# Patient Record
Sex: Male | Born: 1965 | Race: White | Hispanic: No | Marital: Married | Smoking: Current every day smoker
Health system: Southern US, Community
[De-identification: ages and names within clinical notes are randomized; demographics above are authoritative.]

## PROBLEM LIST (undated history)

## (undated) DIAGNOSIS — R079 Chest pain, unspecified: Secondary | ICD-10-CM

## (undated) DIAGNOSIS — J440 Chronic obstructive pulmonary disease with acute lower respiratory infection: Secondary | ICD-10-CM

## (undated) DIAGNOSIS — Z8249 Family history of ischemic heart disease and other diseases of the circulatory system: Secondary | ICD-10-CM

## (undated) DIAGNOSIS — N529 Male erectile dysfunction, unspecified: Secondary | ICD-10-CM

## (undated) DIAGNOSIS — E119 Type 2 diabetes mellitus without complications: Secondary | ICD-10-CM

## (undated) DIAGNOSIS — E781 Pure hyperglyceridemia: Secondary | ICD-10-CM

## (undated) DIAGNOSIS — G4733 Obstructive sleep apnea (adult) (pediatric): Secondary | ICD-10-CM

## (undated) DIAGNOSIS — F338 Other recurrent depressive disorders: Secondary | ICD-10-CM

## (undated) DIAGNOSIS — E669 Obesity, unspecified: Secondary | ICD-10-CM

## (undated) DIAGNOSIS — E785 Hyperlipidemia, unspecified: Secondary | ICD-10-CM

## (undated) DIAGNOSIS — Z862 Personal history of diseases of the blood and blood-forming organs and certain disorders involving the immune mechanism: Secondary | ICD-10-CM

## (undated) DIAGNOSIS — J209 Acute bronchitis, unspecified: Secondary | ICD-10-CM

## (undated) DIAGNOSIS — J45909 Unspecified asthma, uncomplicated: Secondary | ICD-10-CM

## (undated) HISTORY — DX: Personal history of diseases of the blood and blood-forming organs and certain disorders involving the immune mechanism: Z86.2

## (undated) HISTORY — DX: Chest pain, unspecified: R07.9

## (undated) HISTORY — DX: Obesity, unspecified: E66.9

## (undated) HISTORY — DX: Family history of ischemic heart disease and other diseases of the circulatory system: Z82.49

## (undated) HISTORY — DX: Acute bronchitis, unspecified: J20.9

## (undated) HISTORY — DX: Obstructive sleep apnea (adult) (pediatric): G47.33

## (undated) HISTORY — DX: Male erectile dysfunction, unspecified: N52.9

## (undated) HISTORY — DX: Other recurrent depressive disorders: F33.8

## (undated) HISTORY — DX: Pure hyperglyceridemia: E78.1

## (undated) HISTORY — DX: Unspecified asthma, uncomplicated: J45.909

## (undated) HISTORY — DX: Acute bronchitis, unspecified: J44.0

## (undated) HISTORY — DX: Type 2 diabetes mellitus without complications: E11.9

## (undated) HISTORY — DX: Hyperlipidemia, unspecified: E78.5

---

## 2006-03-16 HISTORY — PX: CARPAL TUNNEL RELEASE: SHX101

## 2013-04-12 ENCOUNTER — Ambulatory Visit (INDEPENDENT_AMBULATORY_CARE_PROVIDER_SITE_OTHER): Payer: BC Managed Care – PPO | Admitting: Adult Health

## 2013-04-12 ENCOUNTER — Encounter: Payer: Self-pay | Admitting: Adult Health

## 2013-04-12 VITALS — BP 128/82 | HR 88 | Temp 98.6°F | Resp 12 | Ht 70.0 in | Wt 244.5 lb

## 2013-04-12 DIAGNOSIS — E119 Type 2 diabetes mellitus without complications: Secondary | ICD-10-CM | POA: Insufficient documentation

## 2013-04-12 DIAGNOSIS — F17200 Nicotine dependence, unspecified, uncomplicated: Secondary | ICD-10-CM

## 2013-04-12 DIAGNOSIS — N529 Male erectile dysfunction, unspecified: Secondary | ICD-10-CM

## 2013-04-12 DIAGNOSIS — Z716 Tobacco abuse counseling: Secondary | ICD-10-CM | POA: Insufficient documentation

## 2013-04-12 DIAGNOSIS — R5383 Other fatigue: Secondary | ICD-10-CM

## 2013-04-12 DIAGNOSIS — R5381 Other malaise: Secondary | ICD-10-CM

## 2013-04-12 DIAGNOSIS — Z7189 Other specified counseling: Secondary | ICD-10-CM

## 2013-04-12 LAB — CBC WITH DIFFERENTIAL/PLATELET
BASOS PCT: 0.4 % (ref 0.0–3.0)
Basophils Absolute: 0 10*3/uL (ref 0.0–0.1)
EOS PCT: 2.1 % (ref 0.0–5.0)
Eosinophils Absolute: 0.2 10*3/uL (ref 0.0–0.7)
HCT: 47.7 % (ref 39.0–52.0)
Hemoglobin: 16.1 g/dL (ref 13.0–17.0)
LYMPHS PCT: 22.6 % (ref 12.0–46.0)
Lymphs Abs: 1.9 10*3/uL (ref 0.7–4.0)
MCHC: 33.8 g/dL (ref 30.0–36.0)
MCV: 83.9 fl (ref 78.0–100.0)
MONOS PCT: 7.4 % (ref 3.0–12.0)
Monocytes Absolute: 0.6 10*3/uL (ref 0.1–1.0)
NEUTROS PCT: 67.5 % (ref 43.0–77.0)
Neutro Abs: 5.5 10*3/uL (ref 1.4–7.7)
PLATELETS: 131 10*3/uL — AB (ref 150.0–400.0)
RBC: 5.68 Mil/uL (ref 4.22–5.81)
RDW: 13.7 % (ref 11.5–14.6)
WBC: 8.2 10*3/uL (ref 4.5–10.5)

## 2013-04-12 LAB — BASIC METABOLIC PANEL
BUN: 14 mg/dL (ref 6–23)
CALCIUM: 9.8 mg/dL (ref 8.4–10.5)
CO2: 29 meq/L (ref 19–32)
Chloride: 101 mEq/L (ref 96–112)
Creatinine, Ser: 0.9 mg/dL (ref 0.4–1.5)
GFR: 101.1 mL/min (ref >60.00)
Glucose, Bld: 128 mg/dL — ABNORMAL HIGH (ref 70–99)
Potassium: 4.6 mEq/L (ref 3.5–5.1)
Sodium: 136 mEq/L (ref 135–145)

## 2013-04-12 LAB — HEMOGLOBIN A1C: Hgb A1c MFr Bld: 7 % — ABNORMAL HIGH (ref 4.6–6.5)

## 2013-04-12 LAB — HEPATIC FUNCTION PANEL
ALK PHOS: 81 U/L (ref 39–117)
ALT: 39 U/L (ref 0–53)
AST: 28 U/L (ref 0–37)
Albumin: 4.5 g/dL (ref 3.5–5.2)
BILIRUBIN DIRECT: 0 mg/dL (ref 0.0–0.3)
BILIRUBIN TOTAL: 0.7 mg/dL (ref 0.3–1.2)
TOTAL PROTEIN: 7.3 g/dL (ref 6.0–8.3)

## 2013-04-12 LAB — TSH: TSH: 0.44 u[IU]/mL (ref 0.35–5.50)

## 2013-04-12 NOTE — Assessment & Plan Note (Signed)
Off Wellbutrin x 1 week. Difficulty obtaining erection. Has happened in the past and responded well to cialis. I first want to check labs to evaluate for any physiological problems that may be contributing. Certainly can be from his SAD and now not on any medication. Will check testosterone, CBC. Highly recommend that he stop smoking and explained that smoking contributes to erectile dysfunction.

## 2013-04-12 NOTE — Progress Notes (Signed)
Subjective:    Patient ID: Corey Payne, male    DOB: 05-23-1965, 48 y.o.   MRN: [id number]  HPI  Corey Payne presents to clinic to establish care. He has not had a primary care provider in over 4 years since moving here from [place]. He is concerned about his lack of interest in intimacy with his wife. He has been on Wellbutrin but has weaned himself off because he did not know if this may be contributing to his problem. He is also having problems with erections. He reports "not a full erection". He reports this happened once before and was started on Cialis. This "jump started" him and then he was fine. He has a hx of DM type 2 diet controlled, tobacco abuse, seasonal affective disorder. Usually going to the gym helps with his symptoms of SAD; however, he has been feeling quite fatigued.    Past Medical History  Diagnosis Date  . Diabetes mellitus without complication   . H/O thrombocytosis   . Erectile dysfunction   . Seasonal affective disorder      Past Surgical History  Procedure Laterality Date  . Carpal tunnel release Right 2008     Family History  Problem Relation Age of Onset  . Hypertension Mother   . Heart disease Father     CAD  . Early death Father     Suicide - late 77s  . Arthritis Brother   . Diabetes Maternal Grandmother   . COPD Sister     Emphysema     History   Social History  . Marital Status: Married    Spouse Name: Corey Payne    Number of Children: 2  . Years of Education: 12   Occupational History  . General Manager [employer]   Social History Main Topics  . Smoking status: Current Every Day Smoker -- 0.50 packs/day for 31 years    Types: Cigarettes  . Smokeless tobacco: Never Used  . Alcohol Use: No  . Drug Use: No  . Sexual Activity: Not on file   Other Topics Concern  . Not on file   Social History Narrative   Corey Payne grew up in [place], [place]. He is currently living in [place], [place] with his wife. 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 He travels all around participating in these events. 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 He has 2 children (daughter, son) from a previous marriage.     Review of Systems  Constitutional: Positive for activity change and fatigue. Negative for appetite change.  HENT: Negative.   Eyes: Negative.   Respiratory: Negative.   Cardiovascular: Negative.   Gastrointestinal: Negative.   Endocrine: Negative.   Genitourinary: Negative for frequency, hematuria, difficulty urinating, penile pain and testicular pain.       Erectile dysfunction  Musculoskeletal: Negative.   Skin: Negative.   Allergic/Immunologic: Negative.   Neurological: Negative.   Hematological: Negative.   Psychiatric/Behavioral: Negative.  Negative for suicidal ideas, behavioral problems, sleep disturbance, self-injury and agitation. The patient is not nervous/anxious.        Objective:   Physical Exam  Constitutional: He is oriented to person, place, and time. He appears well-developed and well-nourished. No distress.  HENT:  Head: Normocephalic and atraumatic.  Mouth/Throat: Oropharynx is clear and moist.  Eyes: Conjunctivae and EOM are normal. Pupils are equal, round, and reactive to light.  Neck:  Normal range of motion. Neck supple. No tracheal deviation present.  Cardiovascular: Normal rate, regular rhythm, normal heart sounds and intact distal pulses.  Exam reveals no gallop and no friction rub.   No murmur heard. Pulmonary/Chest: Effort normal and breath sounds normal. No respiratory distress. He has no wheezes. He has no rales.  Abdominal: Soft. Bowel sounds are normal. He exhibits no distension and no mass. There is no tenderness. There is no rebound and no guarding.  Musculoskeletal: Normal range of motion. He exhibits no edema and no tenderness.  Neurological: He is alert and oriented to person, place,  and time. He has normal reflexes. Coordination normal.  Skin: Skin is warm and dry.  Psychiatric: He has a normal mood and affect. His behavior is normal. Judgment and thought content normal.    BP 128/82  Pulse 88  Temp(Src) 98.6 F (37 C) (Oral)  Resp 12  Ht 5\' 10"  (1.778 m)  Wt 244 lb 8 oz (110.904 kg)  BMI 35.08 kg/m2  SpO2 96%       Assessment & Plan:

## 2013-04-12 NOTE — Progress Notes (Signed)
Pre visit review using our clinic review tool, if applicable. No additional management support is needed unless otherwise documented below in the visit note. 

## 2013-04-12 NOTE — Assessment & Plan Note (Signed)
Reports diagnosis approximately 5 years ago. No medications. Does not know what his last hemoglobin A1c was. Reports that he is controlled with diet. Patient is slightly overweight. Check hemoglobin A1c.

## 2013-04-12 NOTE — Assessment & Plan Note (Signed)
Long discussion about effects of tobacco on his lungs and also cardiovascular system. He is also diabetic which further contributes to long-term effects with end organ damage. He reports that he has quit in the past on his own. He smokes approximately 1/2 pack per day. He agrees that he will consider quitting.

## 2013-04-12 NOTE — Assessment & Plan Note (Addendum)
Pt reports very little energy and very little desire to work out. Check labs: CBC, TSH, testosterone, hemoglobin A1c. Hx of SAD which could also be contributing to his symptoms. No current medication.

## 2013-04-12 NOTE — Patient Instructions (Signed)
   [facility] you for [facility] [place] [facility].  Please have your labs drawn prior to leaving the office.  We will contact you once the labs are available and I have reviewed them.  Recommend that you quit smoking - this will improve your overall health.

## 2013-04-13 ENCOUNTER — Telehealth: Payer: Self-pay | Admitting: Adult Health

## 2013-04-13 ENCOUNTER — Other Ambulatory Visit: Payer: Self-pay | Admitting: Adult Health

## 2013-04-13 LAB — TESTOSTERONE: Testosterone: 379.84 ng/dL (ref 350.00–890.00)

## 2013-04-13 MED ORDER — METFORMIN HCL 500 MG PO TABS: 500.0000 mg | ORAL_TABLET | Freq: Two times a day (BID) | ORAL | Status: DC

## 2013-04-13 NOTE — Telephone Encounter (Signed)
Relevant patient education assigned to patient using Emmi. ° °

## 2013-04-14 ENCOUNTER — Telehealth: Payer: Self-pay

## 2013-04-14 NOTE — Telephone Encounter (Signed)
Relevant patient education assigned to patient using Emmi. ° °

## 2013-05-14 HISTORY — PX: CARDIAC CATHETERIZATION: SHX172

## 2013-06-06 ENCOUNTER — Encounter: Payer: Self-pay | Admitting: Adult Health

## 2013-06-06 ENCOUNTER — Ambulatory Visit (INDEPENDENT_AMBULATORY_CARE_PROVIDER_SITE_OTHER): Payer: BC Managed Care – PPO | Admitting: Adult Health

## 2013-06-06 VITALS — BP 138/76 | HR 99 | Temp 98.5°F | Wt 239.0 lb

## 2013-06-06 DIAGNOSIS — R9431 Abnormal electrocardiogram [ECG] [EKG]: Secondary | ICD-10-CM

## 2013-06-06 DIAGNOSIS — R931 Abnormal findings on diagnostic imaging of heart and coronary circulation: Secondary | ICD-10-CM

## 2013-06-06 DIAGNOSIS — F419 Anxiety disorder, unspecified: Secondary | ICD-10-CM

## 2013-06-06 DIAGNOSIS — F411 Generalized anxiety disorder: Secondary | ICD-10-CM

## 2013-06-06 DIAGNOSIS — R0789 Other chest pain: Secondary | ICD-10-CM | POA: Insufficient documentation

## 2013-06-06 MED ORDER — BUSPIRONE HCL 10 MG PO TABS: 10.0000 mg | ORAL_TABLET | Freq: Two times a day (BID) | ORAL | Status: DC

## 2013-06-06 NOTE — Progress Notes (Signed)
Patient ID: Corey Payne, male   DOB: 08/07/1965, 48 y.o.   MRN: [id number]    Subjective:    Patient ID: Corey Payne, male    DOB: 10/25/1965, 48 y.o.   MRN: [id number]  HPI  [name] is a pleasant 48 y/o male who presents to clinic with anxiety and nausea, loose stools. He reports that anxiety is what is triggering his stomach symptoms. Has increased stress at work. Feels chest tightness with anxiety. Has hx of panic attacks with exact symptoms as he is currently having. No chest pain, shortness of breath or syncope. Denies arm pain, jaw pain. Reports symptoms are worse at work.    Past Medical History  Diagnosis Date  . Diabetes mellitus without complication   . H/O thrombocytosis   . Erectile dysfunction   . Seasonal affective disorder     Current Outpatient Prescriptions on File Prior to Visit  Medication Sig Dispense Refill  . Ascorbic Acid (VITAMIN C) 1000 MG tablet Take 1,000 mg by mouth daily.      . B Complex-C (SUPER B COMPLEX PO) Take by mouth.      Marland Kitchen. CINNAMON PO Take 1,000 Units by mouth daily.      . Fish Oil-Cholecalciferol (FISH OIL + D3 PO) Take 1 tablet by mouth daily.      . metFORMIN (GLUCOPHAGE) 500 MG tablet Take 1 tablet (500 mg total) by mouth 2 (two) times daily with a meal.  60 tablet  2  . Multiple Vitamin (MULTIVITAMIN) tablet Take 1 tablet by mouth daily.       No current facility-administered medications on file prior to visit.     Review of Systems  Respiratory: Positive for cough and chest tightness. Negative for shortness of breath and wheezing.   Cardiovascular: Negative for chest pain, palpitations and leg swelling.  Gastrointestinal: Positive for nausea.       Epigastric discomfort  Psychiatric/Behavioral: The patient is nervous/anxious (increase stress at work).        Objective:  Wt 239 lb (108.41 kg)   Physical Exam  Constitutional: He is oriented to person, place, and time. No distress.  Cardiovascular: Normal rate and regular  rhythm.   Pulmonary/Chest: Effort normal and breath sounds normal. No respiratory distress. He has no wheezes. He has no rales.  Musculoskeletal: Normal range of motion.  Neurological: He is alert and oriented to person, place, and time.  Skin: Skin is warm and dry.  Psychiatric: He has a normal mood and affect. His behavior is normal. Judgment and thought content normal.       Assessment & Plan:   1. Anxiety H/O panic attacks. Has increasing stress at work. Symptoms are worse at work. Try buspar 10 mg bid prn.  2. Chest tightness EKG showing q waves. Family hx of cardiac disease. Pt is a smoker. Refer to cardiology.  - EKG 12-Lead

## 2013-06-06 NOTE — Patient Instructions (Signed)
  I am referring you to cardiology (Dr. Mariah MillingGollan or Dr. Kirke CorinArida) to evaluate the abnormal EKG.  I am sending in a prescription for buspar 10 mg twice a day as needed for anxiety.  I encourage you to stop smoking.

## 2013-06-06 NOTE — Progress Notes (Signed)
Pre visit review using our clinic review tool, if applicable. No additional management support is needed unless otherwise documented below in the visit note. 

## 2013-06-07 ENCOUNTER — Telehealth: Payer: Self-pay | Admitting: Adult Health

## 2013-06-07 ENCOUNTER — Encounter: Payer: Self-pay | Admitting: Cardiology

## 2013-06-07 ENCOUNTER — Ambulatory Visit (INDEPENDENT_AMBULATORY_CARE_PROVIDER_SITE_OTHER): Payer: BC Managed Care – PPO | Admitting: Cardiology

## 2013-06-07 ENCOUNTER — Encounter (INDEPENDENT_AMBULATORY_CARE_PROVIDER_SITE_OTHER): Payer: Self-pay

## 2013-06-07 VITALS — BP 153/89 | HR 94 | Ht 69.0 in | Wt 239.5 lb

## 2013-06-07 DIAGNOSIS — R0683 Snoring: Secondary | ICD-10-CM | POA: Insufficient documentation

## 2013-06-07 DIAGNOSIS — R0789 Other chest pain: Secondary | ICD-10-CM

## 2013-06-07 DIAGNOSIS — I209 Angina pectoris, unspecified: Secondary | ICD-10-CM

## 2013-06-07 DIAGNOSIS — R0609 Other forms of dyspnea: Secondary | ICD-10-CM | POA: Insufficient documentation

## 2013-06-07 DIAGNOSIS — E119 Type 2 diabetes mellitus without complications: Secondary | ICD-10-CM

## 2013-06-07 DIAGNOSIS — N529 Male erectile dysfunction, unspecified: Secondary | ICD-10-CM

## 2013-06-07 DIAGNOSIS — R0989 Other specified symptoms and signs involving the circulatory and respiratory systems: Secondary | ICD-10-CM

## 2013-06-07 DIAGNOSIS — R5381 Other malaise: Secondary | ICD-10-CM

## 2013-06-07 DIAGNOSIS — R5383 Other fatigue: Secondary | ICD-10-CM

## 2013-06-07 MED ORDER — ISOSORBIDE MONONITRATE ER 30 MG PO TB24: 30.0000 mg | ORAL_TABLET | Freq: Every day | ORAL | Status: AC

## 2013-06-07 MED ORDER — CARVEDILOL 3.125 MG PO TABS: 3.1250 mg | ORAL_TABLET | Freq: Two times a day (BID) | ORAL | Status: DC

## 2013-06-07 MED ORDER — ASPIRIN 81 MG PO TABS: 81.0000 mg | ORAL_TABLET | Freq: Every day | ORAL | Status: AC

## 2013-06-07 MED ORDER — NITROGLYCERIN 0.4 MG SL SUBL: 0.4000 mg | SUBLINGUAL_TABLET | SUBLINGUAL | Status: AC | PRN

## 2013-06-07 NOTE — Telephone Encounter (Signed)
Relevant patient education assigned to patient using Emmi. ° °

## 2013-06-07 NOTE — Assessment & Plan Note (Signed)
With his body habitus, there is concern for possible OSA. All deferred evaluation with consider a sleep study and to leave dilation for his chest tightness and dyspnea.

## 2013-06-07 NOTE — Assessment & Plan Note (Addendum)
Given his cardiac risk factors and these symptoms, we discussed potential evaluation. He is at least at moderate risk for cardiac etiology, and therefore I would like to use a more definitive test to evaluate him. He is not on any cardiac medications, so proceed directly cardiac catheterization would not be the most appropriate course of action. Plan:   Initiate carvedilol to 3.25 mg twice a day and Imdur 30 mg daily. He'll also start taking a baby aspirin 81 mg each bedtime.  Check treadmill Myoview this week. He will not start the beta blocker until following the Myoview test  When necessary nitroglycerin.  If his symptoms persists despite the negative Myoview, I would continue to titrate his medications but would have a low threshold to consider invasive evaluation with cardiac catheterization to delineate his anatomy  We'll need to ensure that he has had his lipids checked recently, commercial for restarting statin.

## 2013-06-07 NOTE — Assessment & Plan Note (Signed)
Recently started on metformin 

## 2013-06-07 NOTE — Assessment & Plan Note (Addendum)
The tightness sensation he describes his chest is somewhat concerning for possible angina. There some typical features and some apical features. Typical features include worsening with exertion, and not occurring at rest. Associated with dyspnea. The duration and relief with rest. Atypical symptoms include the fact that is worsened with deep inspiration. Concomitant symptoms of nausea dyspnea and radiation into the jaw/head and arms with tingling in the hands are also more consistent with a possible angina.  My concern is that his symptoms are now occurring with more frequency and less activity. There is a crescendo pattern. Based on the level of activity he is able to do, I would consider his class III symptoms of angina.

## 2013-06-07 NOTE — Patient Instructions (Addendum)
[facility] MYOVIEW  Your caregiver has ordered a Stress Test with nuclear imaging. The purpose of this test is to evaluate the blood supply to your heart muscle. This procedure is referred to as a "Non-Invasive Stress Test." This is because other than having an IV started in your vein, nothing is inserted or "invades" your body. Cardiac stress tests are done to find areas of poor blood flow to the heart by determining the extent of coronary artery disease (CAD). Some patients exercise on a treadmill, which naturally increases the blood flow to your heart, while others who are  unable to walk on a treadmill due to physical limitations have a pharmacologic/chemical stress agent called Lexiscan . This medicine will mimic walking on a treadmill by temporarily increasing your coronary blood flow.   Please note: these test may take anywhere between 2-4 hours to complete  PLE[facility]PORT TO [facility]  THE VOLUNTEERS AT THE FIRST DESK WILL DIRECT YOU WHERE TO GO  Date of Procedure:__________3/27/15___________________________  Arrival Time for Procedure:________09:45 am______________________  Instructions regarding medication:   __x__ : Hold diabetes medication morning of procedure  __x__:  Hold betablocker(s) night before procedure and morning of procedure Start Coreg after your test   PLEASE NOTIFY THE OFFICE AT LEAST 24 HOURS IN ADVANCE IF YOU ARE UNABLE TO KEEP YOUR APPOINTMENT.  [phone] AND  PLEASE NOTIFY NUCLEAR MEDICINE AT [facility] AT LEAST 24 HOURS IN ADVANCE IF YOU ARE UNABLE TO KEEP YOUR APPOINTMENT. [phone]  How to prepare for your Myoview test:  1. Do not eat or drink after midnight 2. No caffeine for 24 hours prior to test 3. No smoking 24 hours prior to test. 4. Your medication may be taken with water.  If your doctor stopped a medication because of this test, do not take that medication. 5. Ladies, please do not wear dresses.  Skirts or pants are appropriate. Please  wear a short sleeve shirt. 6. No perfume, cologne or lotion. 7. Wear comfortable walking shoes. No heels!          Your physician has recommended you make the following change in your medication: Start: Coreg 3.125 mg twice daily Imdur 30 mg daily  Aspirin 81 mg daily   Nitro Sublingual: Place 1 tablet under tongue every 5 minutes x 3. Go to the ED if you have to take second tablet.   Your physician recommends that you schedule a follow-up appointment in:  2 weeks

## 2013-06-07 NOTE — Assessment & Plan Note (Signed)
At this point, I would like to evaluate for coronary ischemia as indicated above.  Chosen a treadmill Myoview to evaluate his exercise tolerance, and symptoms. The imaging study would provide more definitive data will also provide information as to his overall cardiac function. If he does have the reduced ejection fraction I would then proceed with 2-D echocardiogram.

## 2013-06-07 NOTE — Progress Notes (Signed)
PATIENT: Corey Payne MRN: [id number] DOB: January 13, 1966 PCP: Orville Govern, NP  Clinic Note: Chief Complaint  Patient presents with  . other    Ref by Hassie Bruce c/o chest tightness with chest pain with deep breath, sob and fatigue. Meds reviewed verbally with pt.    HPI: Corey Payne is a 48 y.o. male with a PMH below who presents today for cardiac evaluation for worsening symptoms of chest tightness and dyspnea with exertion. 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 He does a history of anxiety attacks, but his current constellation of symptoms were more concerning to his PCP and therefore is referred for evaluation.  Interval History: Riel notes having a preceding flulike illness about 3 months ago around [place] time. This lasted couple weeks that we have done myalgias arthralgias nausea fevers chills coughing etc. Since that time over the last 2 months really 6-8 weeks has been noticing aggressively worsening exertional dyspnea associated with chest tightness. He now is gone the point where he has a hard time walking around the house without having some tightness he says it is all started, once ago he started just not feeling well with stomachaches and tightness in his chest which was of breath. It got progressively worse. He says now he'll have an sensation going from his face and head down into his arms and Center in his chest it is usually made worse with anything that makes him breathe hard chills at that store, or briskly walking.  Simply taking a deep breath sometimes can bring it on.  he does not note orthopnea, but notes that he feels more prone to coughing if he lies flat, but does occasionally wake up short of breath. He doesn't sleep very well and snores quite a bit at night. In addition to these symptoms has noted  more frequent rapid or irregular heartbeats but no syncope or near syncope. No TIA or amaurosis fugax symptoms. Said that the numbness in his left fingers and hand when the chest tightness and dyspnea occurs. He does not mild muscle soreness and aching. This is in conjunction with just generally feeling fatigued. He doesn't have nearly the energy used to.  During the summer and fall months, he was able to wear armor and it was heavy broad swords for hours without problems   He denies any melena, hematochezia or hematuria. No claudication symptoms, but does note some mild neuropathy type symptoms with his feet hurting/burning at the end of the day He also notes he has a significant amount of increased stress related to job issues.   his wife came along today to confirm that he is not missing any symptoms.  Past Medical History  Diagnosis Date  . Diabetes mellitus without complication   . H/O thrombocytosis   . Erectile dysfunction   . Seasonal affective disorder   . Hyperlipidemia   . Asthma   . Family history of premature coronary artery disease     Father had multivessel CAD with CABG at age 37    Prior Cardiac Evaluation and Past Surgical History: Past Surgical History  Procedure Laterality Date  . Carpal tunnel release Right 2008    Allergies  Allergen Reactions  . Cefdinir Hives  . Aloe Rash  . Nickel Rash    Current Outpatient Prescriptions  Medication Sig Dispense Refill  .  B Complex-C (SUPER B COMPLEX PO) Take by mouth.      . busPIRone (BUSPAR) 10 MG tablet Take 1 tablet (10 mg total) by mouth 2 (two) times daily.  60 tablet  5  . Fish Oil-Cholecalciferol (FISH OIL + D3 PO) Take 1 tablet by mouth daily.      . metFORMIN (GLUCOPHAGE) 500 MG tablet Take 1 tablet (500 mg total) by mouth 2 (two) times daily with a meal.  60 tablet  2  . Multiple Vitamin (MULTIVITAMIN) tablet Take 1 tablet by mouth daily.      Marland Kitchen. aspirin 81 MG tablet Take 1 tablet (81 mg total) by mouth  daily.  30 tablet    . carvedilol (COREG) 3.125 MG tablet Take 1 tablet (3.125 mg total) by mouth 2 (two) times daily.  60 tablet  6  . isosorbide mononitrate (IMDUR) 30 MG 24 hr tablet Take 1 tablet (30 mg total) by mouth daily.  30 tablet  6  . nitroGLYCERIN (NITROSTAT) 0.4 MG SL tablet Place 1 tablet (0.4 mg total) under the tongue every 5 (five) minutes as needed for chest pain (every 5 minutes x 3).  90 tablet  3   No current facility-administered medications for this visit.    History   Social History Narrative   Onalee HuaDavid grew up in [place], [place]. He is currently living in [place], [place] with his wife. 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 He travels all around participating in these events. 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 He has 2 children (daughter, son) from a previous marriage.    family history includes Arthritis in his brother; COPD in his sister; Diabetes in his maternal grandmother; Early death in his father; Heart attack in his father; Heart disease in his father; Hypertension in his mother.  ROS: A comprehensive Review of Systems - Negative except Pertinent stent is noted in history of present illness. he has had some nausea associated with the dyspnea. He is also noting some epigastric discomfort and nervousness/anxiety.  PHYSICAL EXAM BP 153/89  Pulse 94  Ht 5\' 9"  (1.753 m)  Wt 239 lb 8 oz (108.636 kg)  BMI 35.35 kg/m2 General appearance: alert, cooperative, appears stated age, no distress and Very healthy, vigorous-appearing gentleman. His BMI probably does not correlate with his muscle mass. He does have some mild trouble obesity but is not moderately obese Neck: no adenopathy, no carotid bruit, no JVD, supple, symmetrical, trachea midline and thyroid not enlarged, symmetric, no tenderness/mass/nodules Lungs: clear to auscultation bilaterally, normal percussion bilaterally and Nonlabored,  good air movement Heart: regular rate and rhythm, S1, S2 normal, no murmur, click, rub or gallop and normal apical impulse Abdomen: soft, non-tender; bowel sounds normal; no masses,  no organomegaly and Very sensitive to touch in the left upper quadrant, stating it is ticklish Extremities: extremities normal, atraumatic, no cyanosis or edema Pulses: 2+ and symmetric Skin: Skin color, texture, turgor normal. No rashes or lesions Neurologic: Alert and oriented X 3, normal strength and tone. Normal symmetric reflexes. Normal coordination and gait; CN II-12 grossly intact    Adult ECG Report  Rate: 94  ;  Rhythm: normal sinus rhythm  QRS Axis, PR Interval, QRS Duration, QTc, Voltages:  normal   Conduction Disturbances: none  Other Abnormalities: none   Narrative Interpretation:  normal EKG   Recent Labs:  reviewed in Epic from January ; he states that  his cholesterol has been well controlled recently. He has been on statin for a while for high triglycerides. He is no longer on statin.   ASSESSMENT / PLAN:  Ryheem is a very pleasant middle-aged gentleman with a strong family history of CAD and cardiac risk factors including diabetes, smoker, borderline hypertension, overweight, history of erectile dysfunction. He presents with signs and symptoms are somewhat concerning for possible angina versus even potentially heart failure/cardiomyopathy related to his recent viral illness..   Chest tightness The tightness sensation he describes his chest is somewhat concerning for possible angina. There some typical features and some apical features. Typical features include worsening with exertion, and not occurring at rest. Associated with dyspnea. The duration and relief with rest. Atypical symptoms include the fact that is worsened with deep inspiration. Concomitant symptoms of nausea dyspnea and radiation into the jaw/head and arms with tingling in the hands are also more consistent with a possible  angina.  My concern is that his symptoms are now occurring with more frequency and less activity. There is a crescendo pattern. Based on the level of activity he is able to do, I would consider his class III symptoms of angina.  Angina, class III Given his cardiac risk factors and these symptoms, we discussed potential evaluation. He is at least at moderate risk for cardiac etiology, and therefore I would like to use a more definitive test to evaluate him. He is not on any cardiac medications, so proceed directly cardiac catheterization would not be the most appropriate course of action. Plan:   Initiate carvedilol to 3.25 mg twice a day and Imdur 30 mg daily. He'll also start taking a baby aspirin 81 mg each bedtime.  Check treadmill Myoview this week. He will not start the beta blocker until following the Myoview test  When necessary nitroglycerin.  If his symptoms persists despite the negative Myoview, I would continue to titrate his medications but would have a low threshold to consider invasive evaluation with cardiac catheterization to delineate his anatomy  We'll need to ensure that he has had his lipids checked recently, commercial for restarting statin.  Exertional dyspnea At this point, I would like to evaluate for coronary ischemia as indicated above.  Chosen a treadmill Myoview to evaluate his exercise tolerance, and symptoms. The imaging study would provide more definitive data will also provide information as to his overall cardiac function. If he does have the reduced ejection fraction I would then proceed with 2-D echocardiogram.  Snoring With his body habitus, there is concern for possible OSA. All deferred evaluation with consider a sleep study and to leave dilation for his chest tightness and dyspnea.  DM (diabetes mellitus) Recently started on metformin    Orders Placed This Encounter  Procedures  . Myocardial Perfusion Imaging    Standing Status: Future      Number of Occurrences:      Standing Expiration Date: 06/07/2014    Scheduling Instructions:     To be performed at [facility]    Order Specific Question:  Where should this test be performed    Answer:  Other    Order Specific Question:  Type of stress    Answer:  Exercise    Order Specific Question:  Patient weight in lbs    Answer:  239  . EKG 12-Lead    Order Specific Question:  Where should this test be performed    Answer:  [facility]   Meds ordered this encounter  Medications  .  carvedilol (COREG) 3.125 MG tablet    Sig: Take 1 tablet (3.125 mg total) by mouth 2 (two) times daily.    Dispense:  60 tablet    Refill:  6  . isosorbide mononitrate (IMDUR) 30 MG 24 hr tablet    Sig: Take 1 tablet (30 mg total) by mouth daily.    Dispense:  30 tablet    Refill:  6  . aspirin 81 MG tablet    Sig: Take 1 tablet (81 mg total) by mouth daily.    Dispense:  30 tablet  . nitroGLYCERIN (NITROSTAT) 0.4 MG SL tablet    Sig: Place 1 tablet (0.4 mg total) under the tongue every 5 (five) minutes as needed for chest pain (every 5 minutes x 3).    Dispense:  90 tablet    Refill:  3    Followup: 2 weeks  Sergi W. Herbie Baltimore, M.D., M.S. Interventional Cardiolgy [facility]

## 2013-06-08 ENCOUNTER — Other Ambulatory Visit: Payer: Self-pay | Admitting: Adult Health

## 2013-06-08 ENCOUNTER — Telehealth: Payer: Self-pay | Admitting: *Deleted

## 2013-06-08 ENCOUNTER — Observation Stay: Payer: Self-pay | Admitting: Internal Medicine

## 2013-06-08 ENCOUNTER — Telehealth: Payer: Self-pay

## 2013-06-08 DIAGNOSIS — E785 Hyperlipidemia, unspecified: Secondary | ICD-10-CM

## 2013-06-08 DIAGNOSIS — I200 Unstable angina: Secondary | ICD-10-CM

## 2013-06-08 DIAGNOSIS — I10 Essential (primary) hypertension: Secondary | ICD-10-CM

## 2013-06-08 LAB — COMPREHENSIVE METABOLIC PANEL
ALT: 49 U/L (ref 12–78)
Albumin: 3.7 g/dL (ref 3.4–5.0)
Alkaline Phosphatase: 132 U/L — ABNORMAL HIGH
Anion Gap: 5 — ABNORMAL LOW (ref 7–16)
BUN: 16 mg/dL (ref 7–18)
Bilirubin,Total: 0.5 mg/dL (ref 0.2–1.0)
CALCIUM: 8.7 mg/dL (ref 8.5–10.1)
CO2: 27 mmol/L (ref 21–32)
Chloride: 104 mmol/L (ref 98–107)
Creatinine: 0.93 mg/dL (ref 0.60–1.30)
Glucose: 237 mg/dL — ABNORMAL HIGH (ref 65–99)
Osmolality: 281 (ref 275–301)
Potassium: 3.7 mmol/L (ref 3.5–5.1)
SGOT(AST): 24 U/L (ref 15–37)
Sodium: 136 mmol/L (ref 136–145)
TOTAL PROTEIN: 6.8 g/dL (ref 6.4–8.2)

## 2013-06-08 LAB — URINALYSIS, COMPLETE
Bacteria: NONE SEEN
Bilirubin,UR: NEGATIVE
Blood: NEGATIVE
Glucose,UR: >=500 mg/dL (ref 0–75)
Leukocyte Esterase: NEGATIVE
Nitrite: NEGATIVE
Ph: 5 (ref 4.5–8.0)
RBC,UR: <1 /HPF (ref 0–5)
Specific Gravity: 1.025 (ref 1.003–1.030)
WBC UR: <1 /HPF (ref 0–5)

## 2013-06-08 LAB — CBC
HCT: 39.9 % — ABNORMAL LOW (ref 40.0–52.0)
HGB: 14.2 g/dL (ref 13.0–18.0)
MCH: 29.4 pg (ref 26.0–34.0)
MCHC: 35.6 g/dL (ref 32.0–36.0)
MCV: 83 fL (ref 80–100)
Platelet: 105 10*3/uL — ABNORMAL LOW (ref 150–440)
RBC: 4.83 10*6/uL (ref 4.40–5.90)
RDW: 13.9 % (ref 11.5–14.5)
WBC: 9.6 10*3/uL (ref 3.8–10.6)

## 2013-06-08 LAB — CK TOTAL AND CKMB (NOT AT ARMC)
CK, TOTAL: 112 U/L
CK, TOTAL: 97 U/L
CK, Total: 109 U/L
CK-MB: 1.5 ng/mL (ref 0.5–3.6)
CK-MB: 1.2 ng/mL (ref 0.5–3.6)
CK-MB: 1 ng/mL (ref 0.5–3.6)

## 2013-06-08 LAB — TROPONIN I
Troponin-I: <0.02 ng/mL
Troponin-I: <0.02 ng/mL
Troponin-I: <0.02 ng/mL

## 2013-06-08 LAB — TSH: Thyroid Stimulating Horm: 1.15 u[IU]/mL

## 2013-06-08 LAB — HEMOGLOBIN A1C: Hemoglobin A1C: 6.9 % — ABNORMAL HIGH (ref 4.2–6.3)

## 2013-06-08 NOTE — Telephone Encounter (Signed)
lmom to schedule 2 week f/u.

## 2013-06-08 NOTE — Progress Notes (Signed)
Left message on pt cell vm advising him to call the office to schedule a fasting lab appt at his earliest convenience

## 2013-06-08 NOTE — Telephone Encounter (Signed)
Patient called because he was concerned that he was having a reaction to the Imdur he started last night  He stated his heart is racing and he has pain in his chest and jaw  He said he has not taken the nitro yet because he is driving I advised him to have someone drive him to the emergency department He stated he was only a few minutes away and would drive himself

## 2013-06-08 NOTE — Telephone Encounter (Signed)
Patient reacting to Isosorbide 30mg  he is having racing heart beat and headache w/ nausea. Please advise

## 2013-06-09 ENCOUNTER — Encounter: Payer: Self-pay | Admitting: Cardiovascular Disease

## 2013-06-09 DIAGNOSIS — I2510 Atherosclerotic heart disease of native coronary artery without angina pectoris: Secondary | ICD-10-CM

## 2013-06-09 LAB — CBC WITH DIFFERENTIAL/PLATELET
Basophil #: 0 10*3/uL (ref 0.0–0.1)
Basophil %: 0.8 %
Eosinophil #: 0.2 10*3/uL (ref 0.0–0.7)
Eosinophil %: 2.3 %
HCT: 39.9 % — ABNORMAL LOW (ref 40.0–52.0)
HGB: 13.8 g/dL (ref 13.0–18.0)
Lymphocyte #: 2.4 10*3/uL (ref 1.0–3.6)
Lymphocyte %: 37 %
MCH: 28.8 pg (ref 26.0–34.0)
MCHC: 34.6 g/dL (ref 32.0–36.0)
MCV: 83 fL (ref 80–100)
Monocyte #: 0.6 x10 3/mm (ref 0.2–1.0)
Monocyte %: 9.2 %
Neutrophil #: 3.3 10*3/uL (ref 1.4–6.5)
Neutrophil %: 50.7 %
PLATELETS: 95 10*3/uL — AB (ref 150–440)
RBC: 4.79 10*6/uL (ref 4.40–5.90)
RDW: 14.2 % (ref 11.5–14.5)
WBC: 6.5 10*3/uL (ref 3.8–10.6)

## 2013-06-09 LAB — LIPID PANEL
Cholesterol: 178 mg/dL (ref 0–200)
HDL: 19 mg/dL — AB (ref 40–60)
Triglycerides: 748 mg/dL — ABNORMAL HIGH (ref 0–200)

## 2013-06-09 LAB — COMPREHENSIVE METABOLIC PANEL
ALBUMIN: 3.5 g/dL (ref 3.4–5.0)
ALT: 43 U/L (ref 12–78)
AST: 21 U/L (ref 15–37)
Alkaline Phosphatase: 84 U/L
Anion Gap: 7 (ref 7–16)
BILIRUBIN TOTAL: 0.4 mg/dL (ref 0.2–1.0)
BUN: 13 mg/dL (ref 7–18)
CALCIUM: 8.8 mg/dL (ref 8.5–10.1)
Chloride: 106 mmol/L (ref 98–107)
Co2: 26 mmol/L (ref 21–32)
Creatinine: 0.97 mg/dL (ref 0.60–1.30)
EGFR (African American): >60
Glucose: 105 mg/dL — ABNORMAL HIGH (ref 65–99)
OSMOLALITY: 278 (ref 275–301)
POTASSIUM: 4 mmol/L (ref 3.5–5.1)
Sodium: 139 mmol/L (ref 136–145)
TOTAL PROTEIN: 6.6 g/dL (ref 6.4–8.2)

## 2013-06-12 ENCOUNTER — Telehealth: Payer: Self-pay

## 2013-06-12 NOTE — Telephone Encounter (Signed)
Pt called has question about returning to work. He is scheduled for 4/8 with Dr. Herbie BaltimoreHarding, and he asks does Dr. Herbie BaltimoreHarding want him to stay out of work until he sees him again. Please advise.

## 2013-06-21 ENCOUNTER — Ambulatory Visit (INDEPENDENT_AMBULATORY_CARE_PROVIDER_SITE_OTHER): Payer: BC Managed Care – PPO | Admitting: Cardiology

## 2013-06-21 ENCOUNTER — Encounter: Payer: Self-pay | Admitting: *Deleted

## 2013-06-21 ENCOUNTER — Encounter: Payer: Self-pay | Admitting: Cardiology

## 2013-06-21 VITALS — BP 124/88 | HR 76 | Ht 69.0 in | Wt 240.5 lb

## 2013-06-21 DIAGNOSIS — R0989 Other specified symptoms and signs involving the circulatory and respiratory systems: Secondary | ICD-10-CM

## 2013-06-21 DIAGNOSIS — Z7189 Other specified counseling: Secondary | ICD-10-CM

## 2013-06-21 DIAGNOSIS — F17200 Nicotine dependence, unspecified, uncomplicated: Secondary | ICD-10-CM

## 2013-06-21 DIAGNOSIS — R0609 Other forms of dyspnea: Secondary | ICD-10-CM

## 2013-06-21 DIAGNOSIS — R0789 Other chest pain: Secondary | ICD-10-CM

## 2013-06-21 DIAGNOSIS — Z716 Tobacco abuse counseling: Secondary | ICD-10-CM

## 2013-06-21 DIAGNOSIS — E781 Pure hyperglyceridemia: Secondary | ICD-10-CM

## 2013-06-21 DIAGNOSIS — R002 Palpitations: Secondary | ICD-10-CM

## 2013-06-21 MED ORDER — FENOFIBRATE 145 MG PO TABS: 145.0000 mg | ORAL_TABLET | Freq: Every day | ORAL | Status: AC

## 2013-06-21 NOTE — Patient Instructions (Addendum)
Your physician has recommended you make the following change in your medication:  Stop Lipitor but do not throw it away Start Tricor 145 mg daily   Your physician has recommended that you wear an event monitor. Event monitors are medical devices that record the heart's electrical activity. Doctors most often us these monitors to diagnose arrhythmias. Arrhythmias are problems with the speed or rhythm of the heartbeat. The monitor is a small, portable device. You can wear one while you do your normal daily activities. This is usually used to diagnose what is causing palpitations/syncope (passing out). Please call if you do not hear from [facility] next week.   Please stay out of work until Monday   Take your Imdur at night with your aspirin

## 2013-06-22 ENCOUNTER — Telehealth: Payer: Self-pay

## 2013-06-22 NOTE — Telephone Encounter (Signed)
Pt called would like to speak to a nurse regarding a work note.Please call.

## 2013-06-22 NOTE — Telephone Encounter (Signed)
Faxed paperwork to [employer] per patient request.  Original left at front desk for patient to pick up.  Patient is aware.

## 2013-06-23 ENCOUNTER — Encounter: Payer: Self-pay | Admitting: Cardiology

## 2013-06-23 ENCOUNTER — Telehealth: Payer: Self-pay | Admitting: *Deleted

## 2013-06-23 DIAGNOSIS — E781 Pure hyperglyceridemia: Secondary | ICD-10-CM | POA: Insufficient documentation

## 2013-06-23 DIAGNOSIS — R002 Palpitations: Secondary | ICD-10-CM | POA: Insufficient documentation

## 2013-06-23 NOTE — Assessment & Plan Note (Signed)
Based on the Findings, the the etiology is most likely related to respiratory issue. Continue the medications from his recent hospitalization and defer to his primary physician.

## 2013-06-23 NOTE — Telephone Encounter (Signed)
Patient needs a letter stating that job description has been read and that cleared to go back to work full time. Call if you have any questions. Please fax that stat!

## 2013-06-23 NOTE — Assessment & Plan Note (Signed)
Based on his recent hospitalization I took advantage of the opportunity to counsel him for least 5 minutes about the importance of smoking cessation and how this probably set him up for the concern during hospitalization. Not sure if he actually has COPD or if he does have some mild reactive airways disease exacerbated possibly by allergies. However in order for it to prevent any further complications he needs to quit smoking.

## 2013-06-23 NOTE — Assessment & Plan Note (Signed)
Based on the results of cardiac catheterization, but clearly not short macrovascular coronary disease. It is quite possible that is chest tightness could be related to reactive airways disease as part of an exacerbation of bronchitis. He is a long-term smoker, however he had never had any issues before.  He is currently on several inhalers. Overall his symptoms have improveD. As there still concern for possible microvascular disease, we'll continue his aggressive cardiac risk factor might occasion and also continue the nitrates for now.

## 2013-06-23 NOTE — Assessment & Plan Note (Signed)
Based on his history and his current lab results. His triglyceride levels were extremely high in which case it is preferred to treat triglycerides first before having a statin on board. I will therefore start him on FENOFIBRATE and hold Lipitor her lipids as her levels reduced. We can restart Lipitor for his total cholesterol.

## 2013-06-23 NOTE — Assessment & Plan Note (Signed)
At this point I think some of his chest discomfort may very well be related to palpitations. He is not really having any symptoms other than discomfort from them from standpoint of syncope or near-syncope this would suggest that the episodes are not prolonged, but are happening in with relative frequency.  When: Cardiac event monitor for roughly 2 weeks. Continue beta blocker, but may consider switching to a beta-1 selective agent.

## 2013-06-23 NOTE — Progress Notes (Signed)
PCP: Rey,Raquel, NP  Clinic Note: Chief Complaint  Patient presents with  . OTHER    F/u from myoview/sp cardiac cath c/o chest discomfort, sob and lightheadedness with exertion. Meds reviewed verbally with pt.    HPI: Corey Payne is a 48 y.o. male with a Cardiovascular Problem List below who presents today for a hospital followup. He actually presented to [facility] emergency room on the evening of the day after I saw him on March 25 for symptoms concerning for possible exertional angina. Based on my concerns in my initial note, with worsening symptoms on presentation the decision was made to proceed with cardiac catheterization. 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 Therefore the thought processes potentially more respiratory etiology with possible acute bronchitis for the cause of his symptoms and dyspnea. He was started on azithromycin and inhalers. He says for the most part pressure in his chest is improved in addition he is also somewhat improved. He still has some exertional dyspnea  Associated with lightheadedness. An oscillating started on low-dose furosemide for possible diastolic dysfunction related dyspnea  Interval History: Over last several days he started to feel somewhat improved. He was loaded used at the diagnosis of COPD given to him in the hospital. Relatively long smoking history but has never had issues before and has never heard that he had a potential lung issues. He still has some discomfort in his chest is not as early as associated with exertion. His dyspnea has improved notably. No PND, orthopnea or edema. He actually says he has fleeting moments of discomfort in his chest is not the pain is much as it is an irregular sensation that may be his heart is flip-flopping this is not associated with any lightheadedness or dizziness. He denies any syncope or near-syncope. No TIA or amaurosis fugax symptoms.  No melena, hematochezia, hematuria,  or epistaxis.  Although he is starting to feel better he, he is maybe back to about 70% of his baseline. He is just now starting to get back in to try to exercise. No fevers or chills or significant coughing but he does note some congestion and intermittent wheezing that is helped with the inhalers.  Past Medical History  Diagnosis Date  . Diabetes mellitus without complication   . H/O thrombocytosis   . Erectile dysfunction   . Seasonal affective disorder   . Hyperlipidemia   . Asthma   . Family history of premature coronary artery disease     Father had multivessel CAD with CABG at age 19  . Exertional chest pain     Non-obstructive CAD on cath  . COPD (chronic obstructive pulmonary disease) with acute bronchitis      diagnosed while at [facility]  March 2015  . Obesity     BMI is likely overestimated.  . OSA (obstructive sleep apnea)     Presumed  . Hypertriglyceridemia     Prior Cardiac Evaluation and Past Surgical History: Past Surgical History  Procedure Laterality Date  . Carpal tunnel release Right 2008  . Cardiac catheterization  05/2013    [facility]: Moderate 2 vessel CAD. EF 60%. -- 50% OM 2. 20-30% distal LAD and 30% proximal ramus.   MEDICATIONS  Medication Sig Start Date End Date Taking? Authorizing Provider  aspirin 81 MG tablet Take 1 tablet (81 mg total) by mouth daily. 06/07/13  Yes Marykay Lex, MD  B Complex-C (SUPER B COMPLEX PO) Take by mouth.   Yes Historical Provider,  MD  busPIRone (BUSPAR) 10 MG tablet Take 1 tablet (10 mg total) by mouth 2 (two) times daily. 06/06/13  Yes Raquel Rey, NP  carvedilol (COREG) 3.125 MG tablet Take 1 tablet (3.125 mg total) by mouth 2 (two) times daily. 06/07/13  Yes Marykay Lex, MD  Fish Oil-Cholecalciferol (FISH OIL + D3 PO) Take 1 tablet by mouth daily.   Yes Historical Provider, MD  furosemide (LASIX) 20 MG tablet Take 20 mg by mouth daily.  06/09/13  Yes Historical Provider, MD  isosorbide mononitrate (IMDUR) 30 MG 24 hr  tablet Take 1 tablet (30 mg total) by mouth daily. 06/07/13  Yes Marykay Lex, MD  metFORMIN (GLUCOPHAGE) 500 MG tablet Take 1 tablet (500 mg total) by mouth 2 (two) times daily with a meal. 04/13/13  Yes Raquel Rey, NP  metoprolol tartrate (LOPRESSOR) 25 MG tablet Take 25 mg by mouth 2 (two) times daily.  06/09/13  Yes Historical Provider, MD  Multiple Vitamin (MULTIVITAMIN) tablet Take 1 tablet by mouth daily.   Yes Historical Provider, MD  nitroGLYCERIN (NITROSTAT) 0.4 MG SL tablet Place 1 tablet (0.4 mg total) under the tongue every 5 (five) minutes as needed for chest pain (every 5 minutes x 3). 06/07/13  Yes Marykay Lex, MD  Lipitor 20 mg by mouth daily discontinued today  No Change in Social and Family History -- has reduced his smoking to less than 4 cigarettes a day  ROS: A comprehensive Review of Systems - Negative except Pertinent symptoms noted in history  PHYSICAL EXAM BP 124/88  Pulse 76  Ht 5\' 9"  (1.753 m)  Wt 240 lb 8 oz (109.09 kg)  BMI 35.50 kg/m2 General appearance: alert, cooperative, appears stated age, no distress and by BMI is moderately obese, but by bike it is probably less so Neck: no adenopathy, no carotid bruit and no JVD Lungs: clear to auscultation bilaterally, normal percussion bilaterally and non-labored; no obvious wheezes rales or rhonchi Heart: regular rate and rhythm, S1, S2 normal, no murmur, click, rub or gallop ; nondisplaced PMI Abdomen: soft, non-tender; bowel sounds normal; no masses,  no organomegaly; mild truncal obesity Extremities: extremities normal, atraumatic, no cyanosis, and edema Pulses: 2+ and symmetric;  Neurologic: Mental status: Alert, oriented, thought content appropriate Cranial nerves: normal (II-XII grossly intact)  EKG: Normal sinus rhythm, heart rate 76. Normal EKG  Recent Labs from the hospital stay: March 26 and 27th 2015  Troponin negative for ischemia. TSH 1.15. Hemoglobin A1c 6.9.  2 glucose 105, BUN 13, creatinine  0.97, sodium 139, potassium 4.0, chloride 106, bicarbonate 26. Calcium 8.8.  LFTs normal.  Total cholesterol 178, TG 04/22/1946, HDL 19, and LDL unable to calculate a   ASSESSMENT / PLAN: Chest tightness Based on the results of cardiac catheterization, but clearly not short macrovascular coronary disease. It is quite possible that is chest tightness could be related to reactive airways disease as part of an exacerbation of bronchitis. He is a long-term smoker, however he had never had any issues before.  He is currently on several inhalers. Overall his symptoms have improveD. As there still concern for possible microvascular disease, we'll continue his aggressive cardiac risk factor might occasion and also continue the nitrates for now.  Palpitations At this point I think some of his chest discomfort may very well be related to palpitations. He is not really having any symptoms other than discomfort from them from standpoint of syncope or near-syncope this would suggest that the episodes are not  prolonged, but are happening in with relative frequency.  When: Cardiac event monitor for roughly 2 weeks. Continue beta blocker, but may consider switching to a beta-1 selective agent.  Tobacco abuse counseling Based on his recent hospitalization I took advantage of the opportunity to counsel him for least 5 minutes about the importance of smoking cessation and how this probably set him up for the concern during hospitalization. Not sure if he actually has COPD or if he does have some mild reactive airways disease exacerbated possibly by allergies. However in order for it to prevent any further complications he needs to quit smoking.  Hypertriglyceridemia Based on his history and his current lab results. His triglyceride levels were extremely high in which case it is preferred to treat triglycerides first before having a statin on board. I will therefore start him on FENOFIBRATE and hold Lipitor her  lipids as her levels reduced. We can restart Lipitor for his total cholesterol.  Exertional dyspnea Based on the Findings, the the etiology is most likely related to respiratory issue. Continue the medications from his recent hospitalization and defer to his primary physician.    I also spent at least 15 minutes filling out paperwork or his FMLA, I also dictated a return to work letter seen in his chart separately.  He tells me is about 70-80% healthy, is my opinion he should be a return to work this coming Monday, April 13.    Orders Placed This Encounter  Procedures  . EKG 12-Lead    Order Specific Question:  Where should this test be performed    Answer:  [facility]  . Cardiac event monitor    Standing Status: Future     Number of Occurrences:      Standing Expiration Date: 06/21/2014    Scheduling Instructions:     14 day event monitor   Meds ordered this encounter  Medications  . DISCONTD: atorvastatin (LIPITOR) 20 MG tablet    Sig: Take 20 mg by mouth daily.   . metoprolol tartrate (LOPRESSOR) 25 MG tablet    Sig: Take 25 mg by mouth 2 (two) times daily.   . furosemide (LASIX) 20 MG tablet    Sig: Take 20 mg by mouth daily.   . fenofibrate (TRICOR) 145 MG tablet    Sig: Take 1 tablet (145 mg total) by mouth daily.    Dispense:  90 tablet    Refill:  3    Followup: 2-3 months after wearing monitor  Cha W. Herbie BaltimoreHARDING, M.D., M.S. Interventional Cardiologist [facility]

## 2013-06-26 NOTE — Telephone Encounter (Signed)
Letter from Dr Herbie BaltimoreHarding faxed to [employer]

## 2013-06-27 DIAGNOSIS — R002 Palpitations: Secondary | ICD-10-CM

## 2013-06-28 ENCOUNTER — Encounter: Payer: Self-pay | Admitting: Adult Health

## 2013-06-28 ENCOUNTER — Ambulatory Visit (INDEPENDENT_AMBULATORY_CARE_PROVIDER_SITE_OTHER): Payer: BC Managed Care – PPO | Admitting: Adult Health

## 2013-06-28 VITALS — BP 124/80 | HR 90 | Temp 97.7°F | Resp 14 | Wt 239.0 lb

## 2013-06-28 DIAGNOSIS — J020 Streptococcal pharyngitis: Secondary | ICD-10-CM

## 2013-06-28 LAB — POCT RAPID STREP A (OFFICE): Rapid Strep A Screen: NEGATIVE

## 2013-06-28 MED ORDER — LORATADINE 10 MG PO TABS: 10.0000 mg | ORAL_TABLET | Freq: Every day | ORAL | Status: DC

## 2013-06-28 NOTE — Progress Notes (Signed)
Pre visit review using our clinic review tool, if applicable. No additional management support is needed unless otherwise documented below in the visit note. 

## 2013-06-28 NOTE — Progress Notes (Signed)
Subjective:    Patient ID: Corey Payne, male    DOB: 04/19/1965, 48 y.o.   MRN: [id number]  HPI  Post exposure to strep throat. Patient has started to experience sore throat since last Sunday. No fever, chills. Has been drinking hot tea to soothe his throat.  Current Outpatient Prescriptions on File Prior to Visit  Medication Sig Dispense Refill  . aspirin 81 MG tablet Take 1 tablet (81 mg total) by mouth daily.  30 tablet    . B Complex-C (SUPER B COMPLEX PO) Take by mouth.      . busPIRone (BUSPAR) 10 MG tablet Take 1 tablet (10 mg total) by mouth 2 (two) times daily.  60 tablet  5  . carvedilol (COREG) 3.125 MG tablet Take 1 tablet (3.125 mg total) by mouth 2 (two) times daily.  60 tablet  6  . fenofibrate (TRICOR) 145 MG tablet Take 1 tablet (145 mg total) by mouth daily.  90 tablet  3  . Fish Oil-Cholecalciferol (FISH OIL + D3 PO) Take 1 tablet by mouth daily.      . furosemide (LASIX) 20 MG tablet Take 20 mg by mouth daily.       . isosorbide mononitrate (IMDUR) 30 MG 24 hr tablet Take 1 tablet (30 mg total) by mouth daily.  30 tablet  6  . metFORMIN (GLUCOPHAGE) 500 MG tablet Take 1 tablet (500 mg total) by mouth 2 (two) times daily with a meal.  60 tablet  2  . metoprolol tartrate (LOPRESSOR) 25 MG tablet Take 25 mg by mouth 2 (two) times daily.       . Multiple Vitamin (MULTIVITAMIN) tablet Take 1 tablet by mouth daily.      . nitroGLYCERIN (NITROSTAT) 0.4 MG SL tablet Place 1 tablet (0.4 mg total) under the tongue every 5 (five) minutes as needed for chest pain (every 5 minutes x 3).  90 tablet  3   No current facility-administered medications on file prior to visit.    Review of Systems  Constitutional: Negative for fever and chills.  HENT: Positive for postnasal drip and sore throat.   Respiratory: Positive for cough. Negative for shortness of breath and wheezing.   Gastrointestinal: Negative.   Genitourinary: Negative.   Neurological: Negative.     Psychiatric/Behavioral: Negative.   All other systems reviewed and are negative.      Objective:   Physical Exam  Constitutional: He is oriented to person, place, and time. He appears well-developed and well-nourished. No distress.  HENT:  Head: Normocephalic and atraumatic.  Mouth/Throat: No oropharyngeal exudate.  Pharyngeal erythema  Eyes: Conjunctivae and EOM are normal.  Neck: Normal range of motion. Neck supple.  Cardiovascular: Normal rate, regular rhythm and normal heart sounds.  Exam reveals no gallop and no friction rub.   No murmur heard. Pulmonary/Chest: Effort normal and breath sounds normal. No respiratory distress. He has no wheezes. He has no rales.  Musculoskeletal: Normal range of motion.  Lymphadenopathy:    He has no cervical adenopathy.  Neurological: He is alert and oriented to person, place, and time.  Skin: Skin is warm and dry.  Psychiatric: He has a normal mood and affect. His behavior is normal. Judgment and thought content normal.       Assessment & Plan:   1. Streptococcal sore throat Exposure to strep this past weekend. Negative strep test. Suspect sore throat may be coming from high levels of pollen. Recommend gargle with salt water. Take claritin OTC. If  fever or symptoms worsen then RTC. - POCT rapid strep A

## 2013-07-06 ENCOUNTER — Telehealth: Payer: Self-pay | Admitting: *Deleted

## 2013-07-06 NOTE — Telephone Encounter (Signed)
Spoke with patient  Patient stated he was still a [facility]  I advised patient to stay home from work for the afternoon  Patient verbalized understanding

## 2013-07-06 NOTE — Telephone Encounter (Signed)
If he has similar episodes in the future, I will start IMDUR 30 mg.  Did he wear a monitor?  Marykay Lexavid W Harding, MD

## 2013-07-06 NOTE — Telephone Encounter (Signed)
Pt called back stating that he took his nitro as instructed and pain was relieved w/ 1 pill.  He feels that his pulse is racing, but he has not checked his HR. Pt does not have an automatic BP monitor at home, but doesn't "feel that his BP his high". Pt reports that his sx initially started at work, he is feeling much better since taking the nitro, but would like to know if he can go back to work. States that he can stay out of work if needs to, but he is unsure of what he needs to do next. Advised pt to relax and rest for a little while.  He is agreeable to this, but is very concerned about whether he should go back to work or not.

## 2013-07-06 NOTE — Telephone Encounter (Signed)
Patient called stated he was having chest pain 4 out of 10 He is currently driving his car and 3 minutes away from home Pain is in left mid chest  Pain is constant  Patient denies any other symptoms or radiating pain  Patient was at work when pain started and is having "a very stressful week" He has not taken any Nitro yet  Patient advised to take Nitro as ordered as soon as he gets home  Patient instructed to call and inform us if pain does not resolve  Patient instructed to contact EMS if pain does not resolve after Nitro or if he felt his situation became emergent

## 2013-07-07 NOTE — Telephone Encounter (Signed)
Patient called today and said his chest muscles feel sore  He stated he was rubbing his chest a lot last night when it hurt  Patient stated he is able to take tylenol with his other medications  I advised him to call me and let me know if the Tylenol improves his pain  Patient verbalized understanding

## 2013-07-07 NOTE — Telephone Encounter (Signed)
OK 

## 2013-07-07 NOTE — Telephone Encounter (Signed)
No. No reports have been sent.  

## 2013-07-07 NOTE — Telephone Encounter (Signed)
He is currently on Imdur and currently wearing the monitor.

## 2013-07-07 NOTE — Telephone Encounter (Signed)
Sounds like non-cardiac pain. Agree with Tylenol.   Lets see what the monitor shows -- any STAT reports from the company?  Marykay Lexavid W Harding, MD

## 2013-07-07 NOTE — Telephone Encounter (Signed)
No. No reports have been sent.

## 2013-07-10 ENCOUNTER — Telehealth: Payer: Self-pay

## 2013-07-10 NOTE — Telephone Encounter (Signed)
Yes that is fine

## 2013-07-10 NOTE — Telephone Encounter (Signed)
Patient was out of work on Thursday, Friday and Saturday 4/23-4/25 with chest pain episodes.  He is feeling better today  He is asking for a work note for those days

## 2013-07-10 NOTE — Telephone Encounter (Signed)
Pt left vm, would like for Joni Reiningicole to call him regarding a work excuse.

## 2013-07-10 NOTE — Telephone Encounter (Signed)
OK - but I am not back there for a while. ? Will they accept an unsigned note?  Corey Lexavid W Olivette Beckmann, MD

## 2013-07-10 NOTE — Telephone Encounter (Signed)
Message Sent to Dr. Herbie BaltimoreHarding

## 2013-07-11 ENCOUNTER — Encounter: Payer: Self-pay | Admitting: Cardiology

## 2013-07-11 ENCOUNTER — Encounter: Payer: Self-pay | Admitting: *Deleted

## 2013-07-11 NOTE — Telephone Encounter (Signed)
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

## 2013-07-11 NOTE — Telephone Encounter (Signed)
Letter done.  Marykay Lexavid W Harding, MD

## 2013-07-20 ENCOUNTER — Telehealth: Payer: Self-pay | Admitting: *Deleted

## 2013-07-20 ENCOUNTER — Other Ambulatory Visit: Payer: Self-pay

## 2013-07-20 ENCOUNTER — Ambulatory Visit (INDEPENDENT_AMBULATORY_CARE_PROVIDER_SITE_OTHER): Payer: BC Managed Care – PPO

## 2013-07-20 DIAGNOSIS — R002 Palpitations: Secondary | ICD-10-CM

## 2013-07-20 DIAGNOSIS — R0789 Other chest pain: Secondary | ICD-10-CM

## 2013-07-20 NOTE — Telephone Encounter (Signed)
Informed patient of holter results. Patient verbalized understanding.

## 2013-07-20 NOTE — Telephone Encounter (Signed)
Pt is returning your call

## 2013-07-20 NOTE — Telephone Encounter (Signed)
Called patient to inform him of holter results  "Patient is sinus rhythm/sinus tach at times. No arrythmia."  LVM

## 2013-07-20 NOTE — Progress Notes (Signed)
Quick Note:  Final Result: NSR or Sinus Tachy (usually with activity) - no PACs, PVCs or arrythmia  Marykay Lexavid W Harding, MD  ______

## 2013-09-20 ENCOUNTER — Other Ambulatory Visit: Payer: Self-pay

## 2013-09-20 MED ORDER — METFORMIN HCL 500 MG PO TABS: 500.0000 mg | ORAL_TABLET | Freq: Two times a day (BID) | ORAL | Status: DC

## 2013-10-11 ENCOUNTER — Telehealth: Payer: Self-pay | Admitting: *Deleted

## 2013-10-11 NOTE — Telephone Encounter (Signed)
Chart reviewed for DM bundle. Pt due for fasting labs. Left message, requesting call to office to schedule.

## 2013-10-23 ENCOUNTER — Encounter: Payer: Self-pay | Admitting: Adult Health

## 2013-10-23 ENCOUNTER — Ambulatory Visit (INDEPENDENT_AMBULATORY_CARE_PROVIDER_SITE_OTHER): Payer: BC Managed Care – PPO | Admitting: Adult Health

## 2013-10-23 VITALS — BP 129/80 | HR 84 | Temp 97.9°F | Resp 14 | Ht 69.0 in | Wt 240.8 lb

## 2013-10-23 DIAGNOSIS — F411 Generalized anxiety disorder: Secondary | ICD-10-CM

## 2013-10-23 MED ORDER — VENLAFAXINE HCL 37.5 MG PO TABS: 37.5000 mg | ORAL_TABLET | Freq: Two times a day (BID) | ORAL | Status: AC

## 2013-10-23 MED ORDER — CLONAZEPAM 0.5 MG PO TABS
0.5000 mg | ORAL_TABLET | Freq: Two times a day (BID) | ORAL | Status: AC | PRN
Start: 2013-10-23 — End: ?

## 2013-10-23 MED ORDER — METFORMIN HCL 1000 MG PO TABS: 1000.0000 mg | ORAL_TABLET | Freq: Two times a day (BID) | ORAL | Status: AC

## 2013-10-23 NOTE — Progress Notes (Signed)
Pre visit review using our clinic review tool, if applicable. No additional management support is needed unless otherwise documented below in the visit note. 

## 2013-10-23 NOTE — Patient Instructions (Signed)
  Start Effexor 37.5 mg twice a day. This medication will take approximately 6-8 weeks for full benefits.  Also, I am prescribing clonazepam 0.5 mg 1 tablet twice a day when needed for anxiety. This may make you sleepy. Do not drive when taking this medication.   Generalized Anxiety Disorder Generalized anxiety disorder (GAD) is a mental disorder. It interferes with life functions, including relationships, work, and school. GAD is different from normal anxiety, which everyone experiences at some point in their lives in response to specific life events and activities. Normal anxiety actually helps us prepare for and get through these life events and activities. Normal anxiety goes away after the event or activity is over.  GAD causes anxiety that is not necessarily related to specific events or activities. It also causes excess anxiety in proportion to specific events or activities. The anxiety associated with GAD is also difficult to control. GAD can vary from mild to severe. People with severe GAD can have intense waves of anxiety with physical symptoms (panic attacks).  SYMPTOMS The anxiety and worry associated with GAD are difficult to control. This anxiety and worry are related to many life events and activities and also occur more days than not for 6 months or longer. People with GAD also have three or more of the following symptoms (one or more in children):  Restlessness.   Fatigue.  Difficulty concentrating.   Irritability.  Muscle tension.  Difficulty sleeping or unsatisfying sleep. DIAGNOSIS GAD is diagnosed through an assessment by your health care provider. Your health care provider will ask you questions aboutyour mood,physical symptoms, and events in your life. Your health care provider may ask you about your medical history and use of alcohol or drugs, including prescription medicines. Your health care provider may also do a physical exam and blood tests. Certain medical  conditions and the use of certain substances can cause symptoms similar to those associated with GAD. Your health care provider may refer you to a mental health specialist for further evaluation. TREATMENT The following therapies are usually used to treat GAD:   Medication. Antidepressant medication usually is prescribed for long-term daily control. Antianxiety medicines may be added in severe cases, especially when panic attacks occur.   Talk therapy (psychotherapy). Certain types of talk therapy can be helpful in treating GAD by providing support, education, and guidance. A form of talk therapy called cognitive behavioral therapy can teach you healthy ways to think about and react to daily life events and activities.  Stress managementtechniques. These include yoga, meditation, and exercise and can be very helpful when they are practiced regularly. A mental health specialist can help determine which treatment is best for you. Some people see improvement with one therapy. However, other people require a combination of therapies. Document Released: 06/27/2012 Document Revised: 07/17/2013 Document Reviewed: 06/27/2012 [facility] Patient Information 2015 [place], [place]. This information is not intended to replace advice given to you by your health care provider. Make sure you discuss any questions you have with your health care provider.

## 2013-10-23 NOTE — Progress Notes (Signed)
Patient ID: Corey Payne, male   DOB: 01/02/1966, 48 y.o.   MRN: [id number]    Subjective:    Patient ID: Corey Payne, male    DOB: 03/07/1966, 48 y.o.   MRN: [id number]  HPI  Pt presents to clinic for f/u anxiety. He was started on buspar but reports getting headaches so he stopped. Reports that anxiety is worse than it has been in the past. He is not sleeping well. He also reports not able to handle things lately. Has short fuse. Has been on wellbutrin in the past and had side effects. He has tried other SSRIs but does not recall which one. He reports that he feels he needs something to help him.  Past Medical History  Diagnosis Date  . Diabetes mellitus without complication   . H/O thrombocytosis   . Erectile dysfunction   . Seasonal affective disorder   . Hyperlipidemia   . Asthma   . Family history of premature coronary artery disease     Father had multivessel CAD with CABG at age 48  . Exertional chest pain     Non-obstructive CAD on cath  . COPD (chronic obstructive pulmonary disease) with acute bronchitis      diagnosed while at [facility]  March 2015  . Obesity     BMI is likely overestimated.  . OSA (obstructive sleep apnea)     Presumed  . Hypertriglyceridemia     Current Outpatient Prescriptions on File Prior to Visit  Medication Sig Dispense Refill  . aspirin 81 MG tablet Take 1 tablet (81 mg total) by mouth daily.  30 tablet    . B Complex-C (SUPER B COMPLEX PO) Take by mouth.      . carvedilol (COREG) 3.125 MG tablet Take 1 tablet (3.125 mg total) by mouth 2 (two) times daily.  60 tablet  6  . fenofibrate (TRICOR) 145 MG tablet Take 1 tablet (145 mg total) by mouth daily.  90 tablet  3  . Fish Oil-Cholecalciferol (FISH OIL + D3 PO) Take 1 tablet by mouth daily.      . furosemide (LASIX) 20 MG tablet Take 20 mg by mouth daily.       . isosorbide mononitrate (IMDUR) 30 MG 24 hr tablet Take 1 tablet (30 mg total) by mouth daily.  30 tablet  6  . metFORMIN (GLUCOPHAGE)  500 MG tablet Take 1 tablet (500 mg total) by mouth 2 (two) times daily with a meal.  60 tablet  2  . Multiple Vitamin (MULTIVITAMIN) tablet Take 1 tablet by mouth daily.      . nitroGLYCERIN (NITROSTAT) 0.4 MG SL tablet Place 1 tablet (0.4 mg total) under the tongue every 5 (five) minutes as needed for chest pain (every 5 minutes x 3).  90 tablet  3  . metoprolol tartrate (LOPRESSOR) 25 MG tablet Take 25 mg by mouth 2 (two) times daily.        No current facility-administered medications on file prior to visit.     Review of Systems  Constitutional: Negative.   Respiratory: Negative.  Negative for chest tightness and shortness of breath.   Cardiovascular: Negative.  Negative for chest pain, palpitations and leg swelling.  Genitourinary: Negative.   Musculoskeletal: Negative.   Neurological: Negative for dizziness and light-headedness.  Psychiatric/Behavioral: Positive for sleep disturbance. The patient is nervous/anxious.   All other systems reviewed and are negative.      Objective:  BP 129/80  Pulse 84  Temp(Src) 97.9 F (36.6  C) (Oral)  Resp 14  Wt 240 lb 12 oz (109.203 kg)  SpO2 97%   Physical Exam  Constitutional: He is oriented to person, place, and time. He appears well-developed and well-nourished. No distress.  HENT:  Head: Normocephalic and atraumatic.  Eyes: Conjunctivae and EOM are normal.  Neck: Neck supple.  Cardiovascular: Normal rate and regular rhythm.   Pulmonary/Chest: Effort normal. No respiratory distress.  Musculoskeletal: Normal range of motion.  Neurological: He is alert and oriented to person, place, and time. Coordination normal.  Good eye contact. Engaging in conversation. Thought process normal.  Skin: Skin is warm and dry.  Psychiatric: He has a normal mood and affect. His behavior is normal. Judgment and thought content normal.      Assessment & Plan:   1. Generalized anxiety disorder Discontinued buspar as pt could not tolerate. Start  Effexor 37.5 mg bid. If tolerates will send in 75 mg ER tablets for daily use. Also add clonazepam 0.5 mg bid prn for anxiety. We discussed that Effexor will not work overnight. Will need to take for ~ 6-8 weeks for full benefit. Discussed side effects.

## 2014-01-02 ENCOUNTER — Other Ambulatory Visit: Payer: Self-pay | Admitting: Cardiology

## 2014-01-02 NOTE — Telephone Encounter (Signed)
Rx was sent to pharmacy electronically. 

## 2014-07-07 NOTE — H&P (Signed)
PATIENT NAME:  Corey Payne, Corey Payne MR#:  [id number] DATE OF BIRTH:  07-16-1965  DATE OF ADMISSION:  06/08/2013  PRIMARY CARE PHYSICIAN: At [facility] Hughs, FNP.  CARDIOLOGIST: [place].   HISTORY OF PRESENT ILLNESS: The patient is a 49 year old Caucasian male with past medical history significant for history of diabetes, hyperlipidemia, also history of anxiety and tobacco abuse, who presents to the hospital with complaints of 3-day history of chest pains. According to the patient, he was doing well up until 3 days ago, when he started having chest pains. He presented to his primary care physician's office, where EKG was performed, and the patient was sent to Dr. Herbie Baltimore, cardiology at [facility], who scheduled for him a stress test, which is supposed to be performed tomorrow. He also recommended for the patient to be started on aspirin as well as Imdur. Today, however, the patient had significant headache when he woke up in the morning as well as chest pain. Pain was in his neck, and it was worsening as time progressed. It was also accompanied by shortness of breath. The pain is described as midsternal sharp pain, increasing with deep breaths as well as walking around. It lasted for at least a few hours and improved with oxygen given in the Emergency Room. He also noted palpitations, and because his pain was increasing with exertion and getting worse over a period of time, hospitalist services were contacted for admission.   PAST MEDICAL HISTORY: Significant for history of anxiety, depression, history of diabetes mellitus, hyperlipidemia, also tobacco abuse and also recent diagnosis of chest pain.   MEDICATIONS:  1. Aspirin 81 mg p.o. daily. 2. Buspirone 10 mg p.o. twice daily.  3. Carvedilol 3.125 mg p.o. twice daily, which is to be started after stress test. The patient did not initiate this medication yet. 4. Fish oil with vitamin D once daily.  5. Isosorbide mononitrate 30 mg p.o. daily.  6.  Metformin 500 mg p.o. 1 tablet twice daily.  7. Multivitamins once daily.  8. Nitroglycerin 0.4 mg every 5 minutes as needed.  9. Super B complex vitamin.   PAST SURGICAL HISTORY: Right carpal tunnel surgery.   ALLERGIES: OMNICEF WHICH GIVES HIM WELTS, ALSO REACTION TO NICKEL.   FAMILY HISTORY: The patient's mother had hypertension. The patient's father had CHF. He started having problems with MI at age of 33, and he had coronary artery bypass grafting x4 at age of 84. Also has history of diabetes mellitus in the patient's family members.   SOCIAL HISTORY: The patient is married and has 2 children who live away from him, they are adults. He smokes 1/2 pack per day for the past 30 years. No alcohol abuse. He is Production designer, theatre/television/film at [employer].   REVIEW OF SYSTEMS: Positive for fatigue, weakness for a couple of months now. Some weight gain, approximately 20 pounds over the last 3 months. He admits of reading glasses as well as some sinus congestion. He tells me that he had flu in January. Since then, he has been having progressive shortness of breath as well as green phlegm and wheezing, also palpitations and arrhythmias. Admits of some nausea as well as chronic diarrhea, also achiness of muscles for a couple of days. Denies any swelling. Denies any fevers. Admits to fatigue and weakness. Admits of chest pains. Denies any troubles with weight loss.  EYES: Denies any blurry vision, double vision, glaucoma or cataracts.  ENT: Denies any tinnitus, allergies, epistaxis, sinus pain, dentures, difficulty swallowing.  RESPIRATORY: Admits  to wheezes. Denies any hemoptysis. Admits of shortness of breath. Admits to painful respirations.  CARDIOVASCULAR: Denies any orthopnea, edema. Admits to dyspnea on exertion as well as palpitations. Denies any syncope or presyncope.  GASTROINTESTINAL: Admits of nausea. Denies any vomiting or diarrhea except as mentioned above. No abdominal pains, rectal bleeding, change in  bowel habits.  GENITOURINARY: Denies dysuria, hematuria, frequency or incontinence. ENDOCRINOLOGY: Denies any polydipsia, nocturia, thyroid problems, heat or cold intolerance or thirst.  HEMATOLOGIC: Denies anemia, easy bruising or bleeding, swollen glands.  SKIN: Denies acne, rash, change in moles.  MUSCULOSKELETAL: Denies arthritis, cramps, swelling, gout. NEUROLOGIC: No numbness, epilepsy or tremor.  PSYCHIATRIC: Denies anxiety, insomnia or depression.   PHYSICAL EXAMINATION:  VITAL SIGNS: On arrival to the hospital, temperature is 98.2, pulse was 98, respiratory rate was 20, blood pressure 143/68, saturation was 96% on room air.  GENERAL: This is a well-developed, well-nourished, obese Caucasian male, somewhat anxious, sitting on the stretcher.  HEENT: His pupils are equally reactive to light. Extraocular movements are intact. No icterus or conjunctivitis. Has normal hearing. No pharyngeal erythema. Mucosa is moist.  NECK: No masses. Supple. Nontender. Thyroid is not enlarged. No adenopathy. No JVD or carotid bruits bilaterally. Full range of motion.  LUNGS: Markedly diminished breath sounds on both sides. No rales or rhonchi. A few crackles were heard bilaterally in lung fields. No wheezing. The patient does not have labored inspirations or increased effort to breathe. However, he is in mild respiratory distress whenever he speaks for a prolonged period of time. He is also very tight to auscultation.  CARDIOVASCULAR: S1, S2 appreciated. No murmurs, gallops or rubs were noted. PMI not lateralized. Chest is nontender to palpation. Pedal pulses 1+. No lower extremity edema, calf tenderness or cyanosis was noted.  ABDOMEN: Soft, nontender. Bowel sounds are present. No hepatosplenomegaly or masses were noted.  RECTAL: Deferred.  MUSCLE STRENGTH: Able to move all extremities. No cyanosis, degenerative joint disease, or kyphosis. Gait not tested.  SKIN: Did not reveal any rashes, lesions, erythema,  nodularity, induration. It was warm and dry to palpation.  LYMPHATIC: No adenopathy in the cervical region.  NEUROLOGICAL: Cranial nerves grossly intact. Sensory is intact. No dysarthria or aphasia. The patient is alert, oriented to time, person and place, cooperative. Memory is good. No significant confusion, agitation or depression.   DIAGNOSTIC STUDIES: EKG showed normal sinus rhythm at 95 beats per minute, normal axis. No acute ST-T changes were noted. The patient's labs: BMP: Glucose of 237, otherwise BMP was unremarkable. The patient's liver enzymes: Alkaline phosphatase 132, otherwise liver enzymes were normal. Cardiac enzymes x1 were normal. White blood cell count was 9.6, hemoglobin was 14.2, platelet count was 105. Urinalysis: Amber-colored urine, more than 500 glucose, negative for bilirubin, trace ketones, specific gravity 1.025, pH was 5.0, negative for blood, 30 mg/dL protein, negative for nitrites or leukocyte esterase, less than 1 red blood cell as well as white blood cell, no bacteria was seen, less than 1 epithelial cell, and mucus was present. Chest x-ray, portable single view, 06/08/2013 showed no active disease.   ASSESSMENT AND PLAN:  1. Chest pain. I suspect that the patient's chest pain is lung related; however, will admit the patient to medical floor. Since he has a family history of early coronary artery disease as well as hyperlipidemia and diabetes, I would say it would be prudent to make sure that the patient does not have underlying coronary artery disease. Will initiate the patient on metoprolol, aspirin, heparin as  well as nitroglycerin and get Myoview in the morning.  2. Chronic obstructive pulmonary disease exacerbation. Will start the patient on DuoNebs, Symbicort, tiotropium as well as Zithromax. Get sputum cultures.  3. Diabetes mellitus. Get hemoglobin A1c. Will continue metformin as well as sliding scale insulin for now. We need to advance his medications since it  seems to be very poorly controlled. The patient is spilling glucose in his urine.  4. Hyperlipidemia. Will get lipid panel and will continue fish oil.  5. Tobacco abuse. Discussed with the patient for approximately 4 minutes. Will start nicotine replacement therapy, and he is agreeable.   TIME SPENT: 40 minutes.   ____________________________ Katharina Caperima Sherry Rogus, MD rv:lb D: 06/08/2013 13:15:13 ET T: 06/08/2013 13:55:04 ET JOB#: [id number]  cc: Katharina Caperima Lysbeth Dicola, MD, <Dictator> Nikyah Lackman MD ELECTRONICALLY SIGNED 06/20/2013 21:17

## 2014-07-07 NOTE — Consult Note (Signed)
General Aspect Corey Payne is a 49yo Caucasian male w/ PMHx s/f DM2, HTN, HLD, family h/o premature CAD, ongoing tobacco abuse and erectile dysfunction who was admitted to [facility] today for chest pain. Cardiology has been consulted for the same.   He was evaluated for the first time in the office yesterday by Dr. Ellyn Hack. 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 He experienced flu like symptoms ~ 3 months ago which seemingly resolved with supportive care. Since that time, he has noticed more fatigue and intermittent chest pain. More recently, over the past 1-2 weeks, he has developed substernal chest tightness radiating to his jaw occuring with shorter periods of exertion and w/ associated nausea and diaphoresis. He is now unable to cross a room without experiencing chest discomfort and dyspnea. These episodes have become more frequent and prolonged. He notes occasional palpitations. He does report a sharp discomfort at LLSB and aggravation w/ deep inspiration. He denies PND, orthopnea, LE edema, abdominal distention. He has gain ~ 20 lbs in the past 2 months which he attributes to sedentary lifestyle, activity limitation.  He presented to the office yesterday with these complaints. The initial plan was made to initiate low-dose ASA, BB, NTG SL PRN and Imdur. He went home and attempted to use the Imdur yesterday evening. He developed a headache and palpitations. He continued to experience chest discomfort this AM thus prompting his ED presentation.   Present Illness There, EKG revealed no acute ischemic changes. Initial TnI WNL. BMP, CBC largely WNL. TSH WNL. Hgb A1C 6.9%. U/a unremarkable. CXR- no active process. A subsequent troponin returned WNL.  PAST MEDICAL HISTORY: Significant for history of anxiety, depression, history of diabetes mellitus, hyperlipidemia, also tobacco abuse and also recent diagnosis of  chest pain.   PAST SURGICAL HISTORY: Right carpal tunnel surgery.   MEDICATIONS:  1. Aspirin 81 mg p.o. daily. 2. Buspirone 10 mg p.o. twice daily.  3. Carvedilol 3.125 mg p.o. twice daily, which is to be started after stress test. The patient did not initiate this medication yet. 4. Fish oil with vitamin D once daily.  5. Isosorbide mononitrate 30 mg p.o. daily.  6. Metformin 500 mg p.o. 1 tablet twice daily.  7. Multivitamins once daily.  8. Nitroglycerin 0.4 mg every 5 minutes as needed.  9. Super B complex vitamin.   ALLERGIES: OMNICEF WHICH GIVES HIM WELTS, ALSO REACTION TO NICKEL.   FAMILY HISTORY: The patient's mother had hypertension. The patient's father had CHF. He started having problems with MI at age of 65, and he had coronary artery bypass grafting x4 at age of 84. Also has history of diabetes mellitus in the patient's family members.   SOCIAL HISTORY: The patient is married and has 2 children who live away from him, they are adults. He smokes 1/2 pack per day for the past 30 years. No alcohol abuse. He is Freight forwarder at [employer].   Physical Exam:  GEN no acute distress, thin   HEENT pink conjunctivae, PERRL, hearing intact to voice   NECK supple  No masses  trachea midline   RESP normal resp effort  no use of accessory muscles  diffusely diminished breath sounds   CARD Regular rate and rhythm  Normal, S1, S2  No murmur  left-sided chest wall tenderness to palpation   ABD denies tenderness  soft  normal BS   EXTR negative cyanosis/clubbing, negative edema  SKIN normal to palpation   NEURO follows commands, motor/sensory function intact   PSYCH alert, A+O to time, place, person   Review of Systems:  Subjective/Chief Complaint chest pain, dyspnea on exertion   General: Weight loss or gain  Fatigue   Respiratory: Short of breath   Cardiovascular: Chest pain or discomfort  Tightness  Palpitations  Dyspnea   Gastrointestinal: Nausea   Review of  Systems: All other systems were reviewed and found to be negative   Lab Results:  Thyroid:  26-Mar-15 09:46   Thyroid Stimulating Hormone 1.15 (0.45-4.50 (International Unit)  ----------------------- Pregnant patients have  different reference  ranges for TSH:  - - - - - - - - - -  Pregnant, first trimetser:  0.36 - 2.50 uIU/mL)  Hepatic:  26-Mar-15 09:46   Bilirubin, Total 0.5  Alkaline Phosphatase  132 (45-117 NOTE: New Reference Range 02/03/13)  SGPT (ALT) 49  SGOT (AST) 24  Total Protein, Serum 6.8  Albumin, Serum 3.7  Routine Chem:  26-Mar-15 09:46   Hemoglobin A1c ([facility])  6.9 (The American Diabetes Association recommends that a primary goal of therapy should be <7% and that physicians should reevaluate the treatment regimen in patients with HbA1c values consistently >8%.)  Glucose, Serum  237  BUN 16  Creatinine (comp) 0.93  Sodium, Serum 136  Potassium, Serum 3.7  Chloride, Serum 104  CO2, Serum 27  Calcium (Total), Serum 8.7  Osmolality (calc) 281  eGFR (African American) >60  eGFR (Non-African American) >60 (eGFR values <39m/min/1.73 m2 may be an indication of chronic kidney disease (CKD). Calculated eGFR is useful in patients with stable renal function. The eGFR calculation will not be reliable in acutely ill patients when serum creatinine is changing rapidly. It is not useful in  patients on dialysis. The eGFR calculation may not be applicable to patients at the low and high extremes of body sizes, pregnant women, and vegetarians.)  Anion Gap  5  Cardiac:  26-Mar-15 09:46   CK, Total 112 (39-308 NOTE: NEW REFERENCE RANGE  04/17/2013)  CPK-MB, Serum 1.5 (Result(s) reported on 08 Jun 2013 at 11:20AM.)  Troponin I < 0.02 (0.00-0.05 0.05 ng/mL or less: NEGATIVE  Repeat testing in 3-6 hrs  if clinically indicated. >0.05 ng/mL: POTENTIAL  MYOCARDIAL INJURY. Repeat  testing in 3-6 hrs if  clinically indicated. NOTE: An increase or decrease  of 30%  or more on serial  testing suggests a  clinically important change)  Routine UA:  26-Mar-15 10:54   Color (UA) Amber  Clarity (UA) Cloudy  Glucose (UA) >=500  Bilirubin (UA) Negative  Ketones (UA) Trace  Specific Gravity (UA) 1.025  Blood (UA) Negative  pH (UA) 5.0  Protein (UA) 30 mg/dL  Nitrite (UA) Negative  Leukocyte Esterase (UA) Negative (Result(s) reported on 08 Jun 2013 at 11:09AM.)  RBC (UA) <1 /HPF  WBC (UA) <1 /HPF  Bacteria (UA) NONE SEEN  Epithelial Cells (UA) <1 /HPF  Mucous (UA) PRESENT (Result(s) reported on 08 Jun 2013 at 11:09AM.)  Routine Hem:  26-Mar-15 09:46   WBC (CBC) 9.6  RBC (CBC) 4.83  Hemoglobin (CBC) 14.2  Hematocrit (CBC)  39.9  Platelet Count (CBC)  105 (Result(s) reported on 08 Jun 2013 at 10:15AM.)  MCV 83  MCH 29.4  MCHC 35.6  RDW 13.9   EKG:  Interpretation NSR, no ST/T changes   Rate 95   EKG Comparision Not changed from  office tracing 06/07/13   Radiology Results: XRay:    26-Mar-15  10:12, Chest Portable Single View  Chest Portable Single View   REASON FOR EXAM:    Chest pain  COMMENTS:       PROCEDURE: DXR - DXR PORTABLE CHEST SINGLE VIEW  - Jun 08 2013 10:12AM     CLINICAL DATA:  Chest pain    EXAM:  PORTABLE CHEST - 1 VIEW    COMPARISON:  None.    FINDINGS:  The heart size and mediastinalcontours are within normal limits.  Both lungs are clear. The visualized skeletal structures are  unremarkable.     IMPRESSION:  No active disease.      Electronically Signed    By: Kathreen Devoid    On: 06/08/2013 10:15         Verified By: Jennette Banker, M.D., MD    [facility] [employer]: Hives  Vital Signs/Nurse's Notes: **Vital Signs.:   26-Mar-15 13:45  Vital Signs Type Admission  Temperature Temperature (F) 99  Celsius 37.2  Temperature Source oral  Pulse Pulse 84  Respirations Respirations 18  Systolic BP Systolic BP 149  Diastolic BP (mmHg) Diastolic BP (mmHg) 66  Mean BP 82  Pulse Ox % Pulse Ox % 95   Oxygen Delivery Room Air/ 21 %    Impression 49yo Caucasian male w/ PMHx s/f DM2, HTN, HLD, family h/o premature CAD, ongoing tobacco abuse and erectile dysfunction who was admitted to [facility] today for chest pain. Cardiology has been consulted for the same.   1. Unstable angina- the patient presents w/ substernal chest tightness radiating to his jaw w/ associated diaphoresis and nausea occuring w/ decreasing levels of exertion over the course of 1-2 weeks. Objectively, EKG indicates no acute ischemic changes. Initial TnI WNL. His symptoms are mostly typical unstable/accelerating angina (atypical components of aggravation on exertion noted). He is at a high pretest probability due to significant cardiac RFs in premature family h/o CAD, DM2, HTN, HLD and ongoing tobacco abuse. He also has erectile dysfunction w/ may point to underlying PVD. -- Plan cardiac catheterization tomorrow AM -- Continue cycling cardiac biomarkers -- Heparinize if rules in -- Continue NTG SL, morphine PRN for chest pain. If uncontrolled, may need to undergo cath more urgently. -- Continue low-dose ASA, BB, NTG SL for now -- NPO past MN -- Check lipid panel tomorrow AM  2. Hyperlipidemia -- Check lipid panel -- If LDL elevated and/or evidence of ASCVD, will need to start statin therapy  3. Hypertension -- Continue current antihypertensives  4. Ongoing tobacco abuse -- Tobacco cessation strongly advised -- Offer nicotine replacement therapy as a means of cessation assistance -- Suspected underlying COPD- started on empiric abx, spiriva, SABA per primary team  5. DM2 Good control, Hgb A1C 6.9%. -- SSI while inpatient, hold metformin w/ anticipated contrast load for cath. Resume 48 hrs post-cath.   Electronic Signatures for Addendum Section:  Corey Payne (MD) (Signed Addendum 26-Mar-15 16:35)  The patient was seen and examined. I also discussed with case with Dr. Ellyn Hack who saw the patient yesterday. His  symptoms are worrisome for unstable angina with multiple risk factors for CAD. No murmurs by exam.  REcommend cardiac cath tomorrow. Risks, benefits and alternatives were discussed.   Electronic Signatures: Meriel Pica (PA-C)  (Signed 26-Mar-15 15:04)  Authored: General Aspect/Present Illness, History and Physical Exam, Review of System, Home Medications, Labs, EKG , Radiology, Allergies, Vital Signs/Nurse's Notes, Impression/Plan Corey Payne (MD)  (Signed 26-Mar-15 16:35)  Co-Signer: General Aspect/Present Illness, History and Physical Exam, Review of System,  Home Medications, Labs, EKG , Radiology, Allergies, Vital Signs/Nurse's Notes, Impression/Plan   Last Updated: 26-Mar-15 16:35 by Corey Payne (MD)

## 2014-07-07 NOTE — Discharge Summary (Signed)
PATIENT NAME:  Corey Payne, Corey Payne MR#:  [id number] DATE OF BIRTH:  Mar 18, 1965  DATE OF ADMISSION:  06/08/2013  DATE OF DISCHARGE:  06/09/2013  PRIMARY CARE PHYSICIAN:  [facility], Raquel Ray  CARDIOLOGIST:  Dr. Kirke Corin   FINAL DIAGNOSES: 1.  Chest pain.  2.  Chronic obstructive pulmonary disease exacerbation and tobacco abuse.  3.  Hypertension.  4.  Hypertriglyceridemia.  5.  Obesity, with a BMI of 34.5, with probable sleep apnea.  6.  Thrombocytopenia, unknown baseline.   MEDICATIONS ON DISCHARGE: Include Imdur 30 mg daily, aspirin 81 mg daily, metformin 750 mg extended release twice a day, can restart this back on March 28 evening dose, metoprolol 25 mg twice a day, atorvastatin 20 mg at bedtime, Spiriva 1 inhalation daily, Symbicort 2 puffs twice a day, 164.5 mcg per inhalation, albuterol CFC 1 puff 4 times a day as needed for shortness of breath, furosemide 20 mg daily, nicotine patch 14 mg chest wall daily, azithromycin 250 mg orally for 4 more days.   INSTRUCTIONS:  Diet is low sodium diet, carbohydrate-controlled diet, regular consistency.  Activity as tolerated. Follow up with Dr. Kirke Corin in one week, 1 to 2 weeks [facility].   REASON FOR ADMISSION:  The patient was admitted as an observation on 06/08/2013, came in with chest pain. The patient was admitted with COPD exacerbation. Cardiology consultation was obtained for the chest pain, and they decided to do a cardiac catheterization.   LABORATORY AND RADIOLOGICAL DATA DURING THE HOSPITAL COURSE: Included a hemoglobin A1c of 6.9. TSH 1.15. Troponin negative. White blood cell count 9.6, H and H 14.2 and 39.9, platelet count of 105. Glucose 237, BUN 16, creatinine 0.93, sodium 136, potassium 3.7, chloride 104, CO2 of 27, calcium 8.7. Liver function tests normal range. Chest x-ray: No acute cardiopulmonary disease. Urinalysis: 30 mg/dL of protein, greater than 500 mg/dL of glucose. Next 2 troponins negative. LDL cannot be calculated,  with a triglyceride level of 748, HDL 19. Creatinine upon discharge 0.97. Platelet count 95.   Cardiac cath showed moderate 2-vessel coronary artery disease, EF of 60%, 50% mid circumflex, RPLS 50% stenosis. Others are lumen irregularities.   HOSPITAL COURSE PER PROBLEM LIST:  1.  The patient's chest pain: The patient has nonobstructive coronary artery disease. I believe his chest pain was secondary to COPD exacerbation. The patient treated medically for his heart disease with Imdur, aspirin, metoprolol, and Lipitor.   2.  COPD exacerbation and tobacco abuse: Smoking cessation counseling done 3 minutes by me. Nicotine patch applied. The patient's lungs were clear upon discharge. Still had a cough. Will continue the course of Zithromax. Inhalers Spiriva, Symbicort, and albuterol prescribed.   3.  Hypertension: Blood pressure stable. Low-sodium diet discussed.   4.  Diabetes, with hypertriglyceridemia: I told him to hold off on the metformin until the evening of March 28. The patient was started on Lipitor. I do not want to start him on any medications for triglycerides at this point. Low carbohydrate diet discussed at length. Exercise and weight loss discussed.   5.  Obesity, with a BMI of 34.5, with probable sleep apnea. Will need an outpatient sleep study.   6.  Thrombocytopenia, unknown baseline. Follow up as outpatient for this.  TIME SPENT ON DISCHARGE:  35 minutes.    ____________________________ Herschell Dimes. Renae Gloss, MD rjw:mr D: 06/09/2013 15:40:35 ET T: 06/09/2013 21:04:48 ET JOB#: [id number]  cc: Herschell Dimes. Renae Gloss, MD, <Dictator> Muhammad A. Kirke Corin, MD Dr. Orville Govern Salley Scarlet MD  ELECTRONICALLY SIGNED 06/21/2013 16:21

## 2015-06-15 IMAGING — CR DG CHEST 1V PORT
1 series · 1 of 1 positions shown · non-contrast
Comparison: None.

CLINICAL DATA: Chest pain

EXAM:
PORTABLE CHEST - 1 VIEW

[ap]
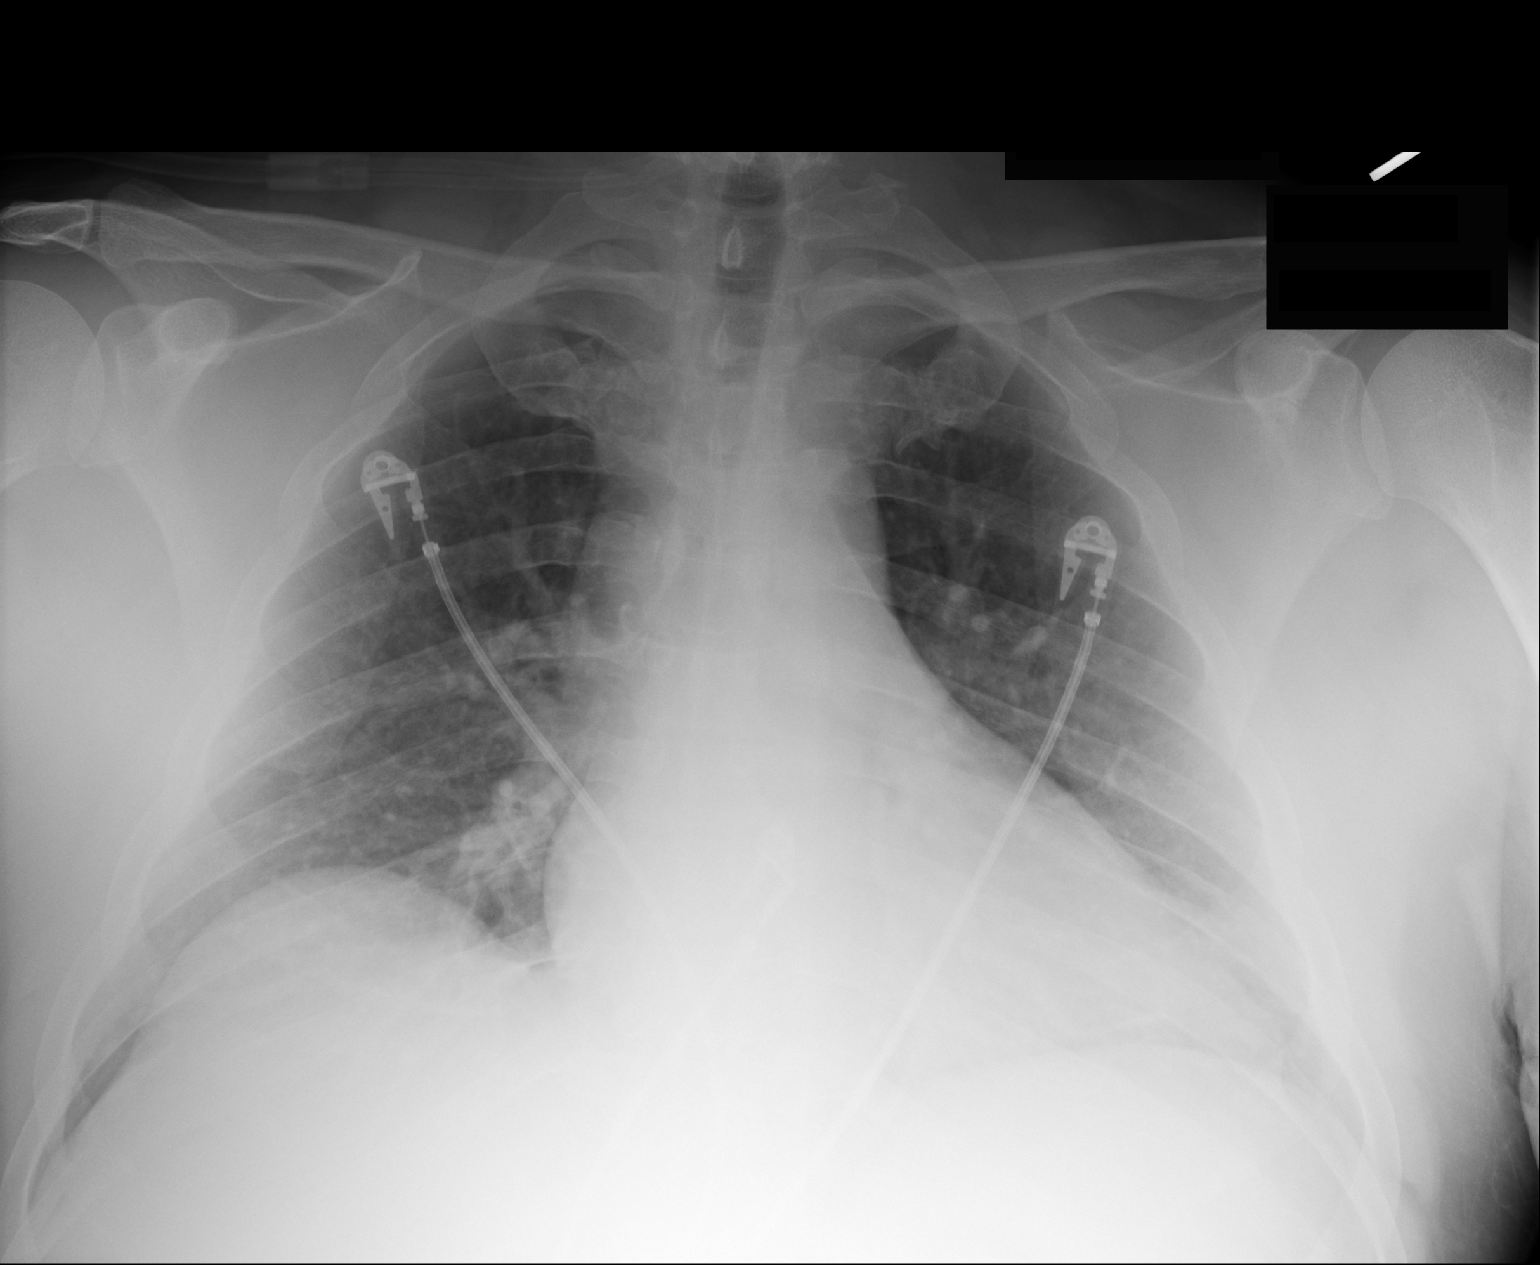

[1 of 1 positions shown; findings below may reference images not displayed]

FINDINGS: The heart size and mediastinal contours are within normal limits.
Both lungs are clear. The visualized skeletal structures are
unremarkable.
IMPRESSION: No active disease.

## 2018-03-29 ENCOUNTER — Telehealth: Payer: Self-pay | Admitting: Pharmacist

## 2018-03-30 NOTE — Telephone Encounter (Signed)
error
